# Patient Record
Sex: Female | Born: 1955
Health system: Southern US, Community
[De-identification: ages and names within clinical notes are randomized; demographics above are authoritative.]

## PROBLEM LIST (undated history)

## (undated) DIAGNOSIS — F32A Depression, unspecified: Secondary | ICD-10-CM

## (undated) DIAGNOSIS — E079 Disorder of thyroid, unspecified: Secondary | ICD-10-CM

## (undated) DIAGNOSIS — Z8719 Personal history of other diseases of the digestive system: Secondary | ICD-10-CM

## (undated) DIAGNOSIS — J189 Pneumonia, unspecified organism: Secondary | ICD-10-CM

## (undated) DIAGNOSIS — T4145XA Adverse effect of unspecified anesthetic, initial encounter: Secondary | ICD-10-CM

## (undated) DIAGNOSIS — G43909 Migraine, unspecified, not intractable, without status migrainosus: Secondary | ICD-10-CM

## (undated) DIAGNOSIS — R0600 Dyspnea, unspecified: Secondary | ICD-10-CM

## (undated) DIAGNOSIS — T8859XA Other complications of anesthesia, initial encounter: Secondary | ICD-10-CM

## (undated) DIAGNOSIS — F329 Major depressive disorder, single episode, unspecified: Secondary | ICD-10-CM

## (undated) DIAGNOSIS — F419 Anxiety disorder, unspecified: Secondary | ICD-10-CM

## (undated) HISTORY — DX: Major depressive disorder, single episode, unspecified: F32.9

## (undated) HISTORY — DX: Migraine, unspecified, not intractable, without status migrainosus: G43.909

## (undated) HISTORY — PX: LASIK: SHX215

## (undated) HISTORY — PX: ABDOMINAL HYSTERECTOMY: SHX81

## (undated) HISTORY — DX: Depression, unspecified: F32.A

## (undated) HISTORY — PX: THYROIDECTOMY: SHX17

## (undated) HISTORY — PX: THYROID CYST EXCISION: SHX2511

## (undated) HISTORY — PX: TONSILLECTOMY: SUR1361

## (undated) HISTORY — DX: Anxiety disorder, unspecified: F41.9

---
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
## 1999-01-17 ENCOUNTER — Other Ambulatory Visit: Admission: RE | Admit: 1999-01-17 | Discharge: 1999-01-17 | Payer: Self-pay | Admitting: Obstetrics & Gynecology

## 1999-07-14 ENCOUNTER — Inpatient Hospital Stay (HOSPITAL_COMMUNITY): Admission: EM | Admit: 1999-07-14 | Discharge: 1999-07-16 | Payer: Self-pay | Admitting: *Deleted

## 2000-02-13 ENCOUNTER — Other Ambulatory Visit: Admission: RE | Admit: 2000-02-13 | Discharge: 2000-02-13 | Payer: Self-pay | Admitting: Obstetrics & Gynecology

## 2001-02-23 ENCOUNTER — Other Ambulatory Visit: Admission: RE | Admit: 2001-02-23 | Discharge: 2001-02-23 | Payer: Self-pay | Admitting: Obstetrics & Gynecology

## 2002-01-05 ENCOUNTER — Encounter: Payer: Self-pay | Admitting: Obstetrics & Gynecology

## 2002-01-05 ENCOUNTER — Encounter: Admission: RE | Admit: 2002-01-05 | Discharge: 2002-01-05 | Payer: Self-pay | Admitting: Obstetrics & Gynecology

## 2002-02-23 ENCOUNTER — Other Ambulatory Visit: Admission: RE | Admit: 2002-02-23 | Discharge: 2002-02-23 | Payer: Self-pay | Admitting: Obstetrics & Gynecology

## 2002-07-28 HISTORY — PX: LEG SURGERY: SHX1003

## 2002-10-27 ENCOUNTER — Ambulatory Visit (HOSPITAL_COMMUNITY): Admission: RE | Admit: 2002-10-27 | Discharge: 2002-10-27 | Payer: Self-pay | Admitting: Surgery

## 2002-10-27 ENCOUNTER — Encounter: Payer: Self-pay | Admitting: Surgery

## 2002-11-16 ENCOUNTER — Ambulatory Visit (HOSPITAL_COMMUNITY): Admission: RE | Admit: 2002-11-16 | Discharge: 2002-11-16 | Payer: Self-pay | Admitting: Surgery

## 2002-11-16 ENCOUNTER — Encounter: Payer: Self-pay | Admitting: Surgery

## 2002-11-25 ENCOUNTER — Encounter: Payer: Self-pay | Admitting: Surgery

## 2002-11-25 ENCOUNTER — Encounter (INDEPENDENT_AMBULATORY_CARE_PROVIDER_SITE_OTHER): Payer: Self-pay | Admitting: *Deleted

## 2002-11-25 ENCOUNTER — Ambulatory Visit (HOSPITAL_COMMUNITY): Admission: RE | Admit: 2002-11-25 | Discharge: 2002-11-25 | Payer: Self-pay | Admitting: Surgery

## 2002-11-30 ENCOUNTER — Encounter: Payer: Self-pay | Admitting: Obstetrics and Gynecology

## 2002-11-30 ENCOUNTER — Ambulatory Visit (HOSPITAL_COMMUNITY): Admission: RE | Admit: 2002-11-30 | Discharge: 2002-11-30 | Payer: Self-pay | Admitting: Obstetrics and Gynecology

## 2002-12-06 ENCOUNTER — Ambulatory Visit: Admission: RE | Admit: 2002-12-06 | Discharge: 2002-12-06 | Payer: Self-pay | Admitting: Gynecology

## 2002-12-21 ENCOUNTER — Encounter: Payer: Self-pay | Admitting: Obstetrics & Gynecology

## 2002-12-27 ENCOUNTER — Inpatient Hospital Stay (HOSPITAL_COMMUNITY): Admission: RE | Admit: 2002-12-27 | Discharge: 2002-12-29 | Payer: Self-pay | Admitting: Obstetrics & Gynecology

## 2002-12-27 ENCOUNTER — Encounter (INDEPENDENT_AMBULATORY_CARE_PROVIDER_SITE_OTHER): Payer: Self-pay

## 2003-02-02 ENCOUNTER — Encounter: Payer: Self-pay | Admitting: Obstetrics & Gynecology

## 2003-02-02 ENCOUNTER — Encounter: Admission: RE | Admit: 2003-02-02 | Discharge: 2003-02-02 | Payer: Self-pay | Admitting: Obstetrics & Gynecology

## 2003-02-09 ENCOUNTER — Encounter: Admission: RE | Admit: 2003-02-09 | Discharge: 2003-02-09 | Payer: Self-pay | Admitting: Obstetrics & Gynecology

## 2003-02-09 ENCOUNTER — Encounter: Payer: Self-pay | Admitting: Obstetrics & Gynecology

## 2003-06-26 ENCOUNTER — Encounter: Admission: RE | Admit: 2003-06-26 | Discharge: 2003-06-26 | Payer: Self-pay | Admitting: Obstetrics & Gynecology

## 2003-12-28 ENCOUNTER — Encounter: Admission: RE | Admit: 2003-12-28 | Discharge: 2003-12-28 | Payer: Self-pay | Admitting: Obstetrics & Gynecology

## 2004-08-01 ENCOUNTER — Ambulatory Visit: Payer: Self-pay | Admitting: Internal Medicine

## 2004-08-02 ENCOUNTER — Ambulatory Visit (HOSPITAL_COMMUNITY): Admission: RE | Admit: 2004-08-02 | Discharge: 2004-08-02 | Payer: Self-pay | Admitting: Internal Medicine

## 2004-08-05 ENCOUNTER — Ambulatory Visit (HOSPITAL_COMMUNITY): Admission: RE | Admit: 2004-08-05 | Discharge: 2004-08-05 | Payer: Self-pay | Admitting: Internal Medicine

## 2004-09-16 ENCOUNTER — Encounter: Payer: Self-pay | Admitting: Psychiatry

## 2004-10-31 ENCOUNTER — Ambulatory Visit: Payer: Self-pay | Admitting: Internal Medicine

## 2004-11-07 ENCOUNTER — Ambulatory Visit: Payer: Self-pay | Admitting: Internal Medicine

## 2004-12-12 ENCOUNTER — Ambulatory Visit: Payer: Self-pay | Admitting: Internal Medicine

## 2004-12-26 ENCOUNTER — Ambulatory Visit: Payer: Self-pay | Admitting: Internal Medicine

## 2005-01-09 ENCOUNTER — Encounter: Admission: RE | Admit: 2005-01-09 | Discharge: 2005-01-09 | Payer: Self-pay | Admitting: Internal Medicine

## 2005-06-18 ENCOUNTER — Ambulatory Visit: Payer: Self-pay | Admitting: Internal Medicine

## 2005-07-02 ENCOUNTER — Encounter (HOSPITAL_COMMUNITY): Admission: RE | Admit: 2005-07-02 | Discharge: 2005-09-30 | Payer: Self-pay | Admitting: Internal Medicine

## 2005-07-15 ENCOUNTER — Encounter (INDEPENDENT_AMBULATORY_CARE_PROVIDER_SITE_OTHER): Payer: Self-pay | Admitting: *Deleted

## 2005-07-15 ENCOUNTER — Encounter: Admission: RE | Admit: 2005-07-15 | Discharge: 2005-07-15 | Payer: Self-pay | Admitting: Internal Medicine

## 2005-07-15 ENCOUNTER — Other Ambulatory Visit: Admission: RE | Admit: 2005-07-15 | Discharge: 2005-07-15 | Payer: Self-pay | Admitting: Interventional Radiology

## 2005-08-01 ENCOUNTER — Ambulatory Visit: Payer: Self-pay | Admitting: Endocrinology

## 2005-09-26 ENCOUNTER — Ambulatory Visit: Payer: Self-pay | Admitting: Internal Medicine

## 2006-01-14 ENCOUNTER — Encounter: Admission: RE | Admit: 2006-01-14 | Discharge: 2006-01-14 | Payer: Self-pay | Admitting: Internal Medicine

## 2006-07-01 ENCOUNTER — Ambulatory Visit: Payer: Self-pay | Admitting: Internal Medicine

## 2006-12-10 ENCOUNTER — Ambulatory Visit: Payer: Self-pay | Admitting: Endocrinology

## 2006-12-11 ENCOUNTER — Encounter: Payer: Self-pay | Admitting: Endocrinology

## 2006-12-11 LAB — CONVERTED CEMR LAB
Calcium, Total (PTH): 10 mg/dL (ref 8.4–10.5)
PTH: 14.4 pg/mL (ref 14.0–72.0)

## 2007-01-22 ENCOUNTER — Encounter: Admission: RE | Admit: 2007-01-22 | Discharge: 2007-01-22 | Payer: Self-pay | Admitting: Nurse Practitioner

## 2007-03-24 ENCOUNTER — Encounter: Payer: Self-pay | Admitting: Endocrinology

## 2007-03-24 DIAGNOSIS — I10 Essential (primary) hypertension: Secondary | ICD-10-CM | POA: Insufficient documentation

## 2007-03-24 DIAGNOSIS — E039 Hypothyroidism, unspecified: Secondary | ICD-10-CM | POA: Insufficient documentation

## 2007-07-05 ENCOUNTER — Encounter: Payer: Self-pay | Admitting: Nurse Practitioner

## 2007-07-29 ENCOUNTER — Encounter: Payer: Self-pay | Admitting: Nurse Practitioner

## 2008-02-18 ENCOUNTER — Encounter: Admission: RE | Admit: 2008-02-18 | Discharge: 2008-02-18 | Payer: Self-pay | Admitting: Nurse Practitioner

## 2010-02-10 ENCOUNTER — Emergency Department (HOSPITAL_COMMUNITY)
Admission: EM | Admit: 2010-02-10 | Discharge: 2010-02-10 | Payer: Self-pay | Source: Home / Self Care | Admitting: Emergency Medicine

## 2010-06-14 ENCOUNTER — Ambulatory Visit (HOSPITAL_COMMUNITY): Admission: RE | Admit: 2010-06-14 | Discharge: 2010-06-14 | Payer: Self-pay | Admitting: Nurse Practitioner

## 2010-08-19 ENCOUNTER — Encounter: Payer: Self-pay | Admitting: Nurse Practitioner

## 2010-10-10 ENCOUNTER — Other Ambulatory Visit: Payer: Self-pay | Admitting: Oral and Maxillofacial Surgery

## 2010-10-12 LAB — URINALYSIS, ROUTINE W REFLEX MICROSCOPIC
Glucose, UA: NEGATIVE mg/dL
Nitrite: NEGATIVE
Protein, ur: 100 mg/dL — AB
Specific Gravity, Urine: >1.03 — ABNORMAL HIGH (ref 1.005–1.030)
Urobilinogen, UA: 0.2 mg/dL (ref 0.0–1.0)
pH: 6 (ref 5.0–8.0)

## 2010-10-12 LAB — URINE MICROSCOPIC-ADD ON

## 2010-12-10 NOTE — Consult Note (Signed)
[facility]                          ENDOCRINOLOGY CONSULTATION   Yolanda Miller, Yolanda Miller                     MRN:          [id number]  DATE:12/10/2006                            DOB:          1955/10/18    REASON FOR REFERRAL:  Hyperparathyroidism.   HISTORY OF PRESENT ILLNESS:  A 55 year old woman with 3 weeks of  moderate tingling in her arms and legs.  She has no associated cramps.  She states she was found by Ninfa Linden, FNP, to have an elevated  parathyroid level.   PAST MEDICAL HISTORY:  1. Hypothyroidism.  2. Hypertension.  3. She had a right thyroid lobectomy in 1978 for a suspicious nodule,      which proved benign on pathology.   SOCIAL HISTORY:  She is married.  She works as a Sales executive.   FAMILY HISTORY:  Negative for parathyroid disease.   REVIEW OF SYSTEMS:  She denies seizure, weight gain and weight loss.   PHYSICAL EXAMINATION:  VITAL SIGNS:  Blood pressure is 126/82, heart  rate 69, temperature is 99.2.  The weight is 151.  GENERAL:  No distress.  SKIN:  Not diaphoretic.  No rash.  HEENT:  No proptosis.  No periorbital swelling.  NECK:  A healed surgical scar.  There is no nodule palpable in the  thyroid and no lymphadenopathy at the neck.  CHEST:  Clear to auscultation.  No respiratory distress.  CARDIOVASCULAR:  No JVD.  No edema.  Regular rate and rhythm.  No  murmur.  Pedal pulses are intact.  ABDOMEN:  Soft, nontender, no hepatosplenomegaly, no mass, no hernia.  EXTREMITIES:  No deformity.  NEUROLOGIC:  Alert and oriented.  Sensation is diffusely intact to  touch.  Muscle bulk and strength are normal.   LABORATORY DATA:  On Dec 08, 2006, calcium 10.0.  TSH 0.43.  On Dec 10, 2006, parathyroid hormone 14.4 pg/mL (normal 14-72).  Calcium 10.0,  which is mid-normal.   IMPRESSION:  1. Apparent recent history of hyperparathyroidism.  2. Chronic hypothyroidism exacerbated by her 1978 surgery.  3. Symptom  complex of tingling and cramps, which does not appear to be      thyroid nor parathyroid-related.   PLAN:  She should have an annual TSH and calcium level, and I am happy  to see her sooner if necessary.     Sean A. Everardo All, MD  Electronically Signed    SAE/MedQ  DD: 12/15/2006  DT: 12/15/2006  Job #: [id number]   cc:   Ninfa Linden, FNP

## 2010-12-10 NOTE — Consult Note (Signed)
[facility]                          ENDOCRINOLOGY CONSULTATION   Yolanda, Miller                     MRN:          [id number]  DATE:12/10/2006                            DOB:          05-Apr-1956    ADDENDUM:  I have received the result of a parathyroid hormone from  Ninfa Linden, FNP.  The result is 10 picograms per mL with a  simultaneous calcium of 10 mg/dL.  These are similar to the numbers we  got in our laboratory on May 15th (PTH of 14 and calcium of 10).  This  should be interpreted as a compensatory decrease in the parathyroid  hormone responding to the tendency of hydrochlorothiazide to increase  the serum calcium, with the result being that the serum calcium is  staying normal. This needs no treatment rule out followup except for an  annual serum calcium level.  I am happy to see the patient back if  necessary.     Sean A. Everardo All, MD  Electronically Signed    SAE/MedQ  DD: 12/17/2006  DT: 12/17/2006  Job #: [id number]   cc:   Ninfa Linden, FNP

## 2010-12-13 NOTE — Op Note (Signed)
[name], Miller                        ACCOUNT NO.:  [id number]   MEDICAL RECORD NO.:  [id number]                   PATIENT TYPE:  INP   LOCATION:  NA                                   FACILITY:  [facility]   PHYSICIAN:  De Blanch, M.D.         DATE OF BIRTH:  Jan 25, 1956   DATE OF PROCEDURE:  12/27/2002  DATE OF DISCHARGE:                                 OPERATIVE REPORT   PREOPERATIVE DIAGNOSES:  1. Complex pelvic mass.  2. Elevated CA125.  3. Intermittent severe pelvic pain.  4. Uterine fibroids.   POSTOPERATIVE DIAGNOSES:  1. Right ovarian mature cystic teratoma with struma ovarii.  2. Uterine fibroids.   PROCEDURES:  Total abdominal hysterectomy, bilateral salpingo-oophorectomy.   SURGEON:  De Blanch, M.D.   ASSISTANTS:  Gerrit Friends. Aldona Bar, M.D., and Telford Nab, R.N.   ANESTHESIA:  General with orotracheal tube.   ESTIMATED BLOOD LOSS:  150 mL.   SURGICAL FINDINGS:  At the time of exploratory laparotomy, the upper abdomen  was normal.  The appendix appeared normal.  The uterus is enlarged to  approximately 10-12 weeks' gestational size with fibroids.  The right ovary  was replaced by a smooth cystic tumor measuring approximately 8 cm in  diameter.  On frozen section we were told this is a mature cystic teratoma  with struma ovarii.  The left tube and ovary appeared normal.   DESCRIPTION OF PROCEDURE:  The patient was brought to the operating room and  after satisfactory attainment of general anesthesia was placed in modified  lithotomy position in the Ragan stirrups.  The anterior abdominal wall,  perineum, and vagina were prepped with Betadine, a Foley catheter was  inserted, and the patient was draped.  The abdomen was entered through a  Pfannenstiel incision.  Peritoneal washings were obtained.  The upper  abdomen was explored with the above-noted findings.  A Bookwalter retractor  was positioned, and the bowel was packed out of the  pelvis.  The right  retroperitoneal spaces were opened, identifying the external iliac vessels  and ureter.  The ovarian vessels were skeletonized, clamped, cut, free-tied,  and suture ligated.  The posterior leaf of the broad ligament was incised  beneath the fallopian tube, mobilizing the tube and ovary.  The uterine  cornu was grasped with a large Kelly clamp and back-clamped with a  parametrial clamp.  This was divided.  The right tube and ovary were handed  off the operative field and submitted for frozen section with the above-  noted findings.  The left round ligament was divided and the retroperitoneal  space opened.  The vessels and ureter were again identified.  The ovarian  vessels were skeletonized, clamped, cut, free-tied, and suture ligated.  The  bladder flap was advanced with sharp and blunt dissection.  The uterine  vessels were skeletonized, clamped, cut, and suture ligated.  The  paracervical and cardinal ligaments were clamped, cut, and suture  ligated.  The rectovaginal septum was opened, further mobilizing the cervix.  The  vaginal angles were then crossclamped, divided, and the vagina transected  from its junction with the cervix.  The uterus, cervix, tubes, and ovaries  were handed off the operative field as a single specimen.  The vaginal  angles were transfixed with 0 Vicryl and the central portion of the vagina  closed with interrupted figure-of-eight sutures of 0 Vicryl.   The pelvis was irrigated with saline.  Hemostasis was ascertained.  Additional hemostasis was achieved with cautery and suture on the posterior  aspect of the left lateral vagina.  At this juncture the hemostasis was  excellent.  The packs and retractors were removed.  The anterior abdominal  wall was closed in layers, the first being a running 2-0 Vicryl suture on  the peritoneum.  The fascia was closed with a continuous suture of 0 PDS.  The subcutaneous tissue was irrigated, hemostasis  achieved with cautery, and  the skin closed with skin staples.  A dressing was applied.  The patient was  awakened from anesthesia and taken to the recovery room in satisfactory  condition.  Sponge, needle, and instrument counts were correct x2.                                               De Blanch, M.D.    DC/MEDQ  D:  12/27/2002  T:  12/27/2002  Job:  [id number]   cc:   Gerrit Friends. Aldona Bar, M.D.  [address]  [place]  [place] [address]  Fax: [phone]   Telford Nab, R.N.

## 2010-12-13 NOTE — Discharge Summary (Signed)
NAMEEMORI, Yolanda Miller                        ACCOUNT NO.:  [id number]   MEDICAL RECORD NO.:  [id number]                   PATIENT TYPE:  INP   LOCATION:  0447                                 FACILITY:  [facility]   PHYSICIAN:  Gerrit Friends. Aldona Bar, M.D.                DATE OF BIRTH:  24-May-1956   DATE OF ADMISSION:  12/27/2002  DATE OF DISCHARGE:  12/29/2002                                 DISCHARGE SUMMARY   DISCHARGE DIAGNOSES:  1. Benign ovarian mature cystic teratoma associated with extensive stroma     eovarii, right ovary.  2. Adenomyosis of the uterus.  3. Leiomyomata uteri.  4. Benign uterine adenomatoid tumor.   SUMMARY:  This 55 year old female was admitted with a complex pelvic mass  and pelvic pain for exploration.  She was seen by De Blanch,  M.D. in consultation and De Blanch, M.D. was the primary  surgeon.  She underwent on the day of admission after a normal preoperative  work-up a total abdominal hysterectomy and bilateral salpingo-oophorectomy  and frozen section and a diagnosis of a dermoid was made.  Postoperatively  she did well.  She remained essentially afebrile during her hospital course.  Her discharge hemoglobin was 9.7 with a white count of 10,300 and a platelet  count of 220,000.  In addition, thyroid function studies were done prior to  discharge and a T4 and TSH were grossly within normal limits.  On the  morning of June 3 she was ambulating well, tolerating a regular diet well,  having normal bowel and bladder function, was afebrile.  Her wound was clean  and dry.  She was comfortable and she was discharged to home.  Staples were  removed on the morning of discharge.  The wound was Steri-Stripped.  She was  given all instructions at the time of discharge and understood all  instructions well.   DISCHARGE MEDICATIONS:  1. Tylox one to two q.4-6h. as needed for severe pain.  2. Motrin 600 mg q.6h. as needed for less severe pain.  3.  She will continue her Synthroid 0.075 mg daily.  4. She will use stool softeners, gas medication, etc. as needed.  5. She was not discharged on any estrogen replacement therapy as she was     having no symptomatology.    DISCHARGE INSTRUCTIONS:  She was instructed to return to the office in four  weeks' time and given all careful instructions at the time of discharge  which she understood well.   CONDITION ON DISCHARGE:  Improved.                                               Gerrit Friends. Aldona Bar, M.D.    RMW/MEDQ  D:  12/29/2002  T:  12/29/2002  Job:  [id number]  cc:   De Blanch, M.D.   Currie Paris, M.D.  [address]  [place]  [place] [address]  Fax: [phone]

## 2010-12-13 NOTE — Consult Note (Signed)
NAMEKEANU, FRICKEY                        ACCOUNT NO.:  [id number]   MEDICAL RECORD NO.:  [id number]                   PATIENT TYPE:  OUT   LOCATION:  GYN                                  FACILITY:  [facility]   PHYSICIAN:  De Blanch, M.D.         DATE OF BIRTH:  Dec 19, 1955   DATE OF CONSULTATION:  12/06/2002  DATE OF DISCHARGE:                                   CONSULTATION   HISTORY OF PRESENT ILLNESS:  A 55 year old white female seen in consultation  at the request of Carrington Clamp, M.D. and Gerrit Friends. Aldona Bar, M.D.  The  patient was recently found to have a knot in her abdomen.  Evaluation has  included a CT scan of the abdomen and pelvis as well as ultrasound.  CT scan  shows a normal upper abdomen, although there is a pelvic mass measuring 9.3  cm in maximum diameter which is thought to be compatible with a dermoid  containing fat and a linear area of calcification, although there is some  lobulation of a more soft tissue solid mass.  The patient also has an  enlarged uterus secondary to uterine fibroids.  The ultrasound shows the  uterus measures 11.8 x 5.9 x 7.3 cm.  The adnexal mass on ultrasound has a  thick rim with an irregularly shaped central area and a solid mass measuring  3 x 1.8 x 2.5 cm in the superior aspect.  There is some free fluid noted as  well.   PAST MEDICAL HISTORY:  Thyroid nodule (benign).   PAST SURGICAL HISTORY:  1. Thyroid biopsy.  2. Cesarean section.  3. Anal fissure repair.  4. Cold knife conization with subsequent Pap smears that are normal.   ALLERGIES:  None.   CURRENT MEDICATIONS:  Synthroid.   FAMILY HISTORY:  The patient's mother had a liposarcoma.  There are no  family members with ovarian cancer, colon cancer, or gynecologic cancer.   REVIEW OF SYSTEMS:  Otherwise negative.   PHYSICAL EXAMINATION:  VITAL SIGNS:  Height 5 feet 4 inches, weight 143  pounds.  GENERAL:  The patient is a healthy, pleasant white female in  no acute  distress.  HEENT:  Negative.  NECK:  Supple without thyromegaly.  LYMPH:  There is no supraclavicular or inguinal adenopathy.  ABDOMEN:  Soft, nontender.  There is a mass palpable in the suprapubic  region approximately 5 cm above the pubis.  This is nontender.  PELVIC:  EGBUS, vagina, bladder, urethra are normal.  Cervix is normal.  Uterus is difficult to separate from a mass __________ there is  approximately 15 cm mass noted.  Rectovaginal examination confirms.  There  is no cul-de-sac nodularity.   IMPRESSION:  Complex pelvic mass, possibly a dermoid.  Of note, the  patient's CA-125 value is 99.  Obviously, this could be on the basis of  endometriosis, fibroids, or other benign causes, although ovarian cancer  cannot be entirely excluded  short of obtaining a histologic diagnosis.   Would recommend the patient undergo an exploratory laparotomy and total  abdominal hysterectomy with bilateral salpingo-oophorectomy.  She is given  the option of preserving normal ovary if one is found but she indicates she  would like to go ahead and have both ovaries removed.  She understands the  implications of hormonal replacement therapy.   We will coordinate scheduling and surgery with Gerrit Friends. Aldona Bar, M.D. and  anticipate surgery for December 27, 2002.   Risks of surgery including hemorrhage, infection, injury to adjacent  viscera, thromboembolic complications, and anesthetic risks have been  outlined to the patient and her husband.  All their questions are answered  and they wish to proceed with surgery understanding that if ovarian cancer  is detected the surgery would be more extended including surgical staging.                                               De Blanch, M.D.    DC/MEDQ  D:  12/06/2002  T:  12/06/2002  Job:  [id number]   cc:   Gerrit Friends. Aldona Bar, M.D.  [address]  [place]  [place] [address]  Fax: [phone]   Carrington Clamp, M.D.  [address]  [address]  [place], [place] [address]  Fax: [phone]   Telford Nab, R.N.

## 2011-07-28 ENCOUNTER — Other Ambulatory Visit (HOSPITAL_COMMUNITY): Payer: Self-pay | Admitting: Nurse Practitioner

## 2011-07-28 DIAGNOSIS — Z139 Encounter for screening, unspecified: Secondary | ICD-10-CM

## 2011-08-04 ENCOUNTER — Ambulatory Visit (HOSPITAL_COMMUNITY): Payer: Self-pay

## 2011-08-11 ENCOUNTER — Ambulatory Visit (HOSPITAL_COMMUNITY)
Admission: RE | Admit: 2011-08-11 | Discharge: 2011-08-11 | Disposition: A | Discharge status: Home / Self Care | Payer: BC Managed Care – PPO | Source: Ambulatory Visit | Attending: Nurse Practitioner | Admitting: Nurse Practitioner

## 2011-08-11 DIAGNOSIS — Z139 Encounter for screening, unspecified: Secondary | ICD-10-CM

## 2011-08-11 DIAGNOSIS — Z1231 Encounter for screening mammogram for malignant neoplasm of breast: Secondary | ICD-10-CM | POA: Insufficient documentation

## 2011-12-21 ENCOUNTER — Encounter (HOSPITAL_COMMUNITY): Payer: Self-pay | Admitting: *Deleted

## 2011-12-21 ENCOUNTER — Emergency Department (HOSPITAL_COMMUNITY)
Admission: EM | Admit: 2011-12-21 | Discharge: 2011-12-22 | Disposition: A | Discharge status: Home / Self Care | Payer: BC Managed Care – PPO | Attending: Emergency Medicine | Admitting: Emergency Medicine

## 2011-12-21 DIAGNOSIS — N390 Urinary tract infection, site not specified: Secondary | ICD-10-CM

## 2011-12-21 DIAGNOSIS — E079 Disorder of thyroid, unspecified: Secondary | ICD-10-CM | POA: Insufficient documentation

## 2011-12-21 HISTORY — DX: Disorder of thyroid, unspecified: E07.9

## 2011-12-21 NOTE — ED Notes (Signed)
Pt c/o blood in urine. Pt states she noticed it today

## 2011-12-22 LAB — URINE MICROSCOPIC-ADD ON

## 2011-12-22 LAB — URINALYSIS, ROUTINE W REFLEX MICROSCOPIC
Bilirubin Urine: NEGATIVE
Glucose, UA: 100 mg/dL — AB
Ketones, ur: NEGATIVE mg/dL
Nitrite: POSITIVE — AB
Protein, ur: 30 mg/dL — AB
Specific Gravity, Urine: <1.005 — ABNORMAL LOW (ref 1.005–1.030)
Urobilinogen, UA: 4 mg/dL — ABNORMAL HIGH (ref 0.0–1.0)
pH: 6.5 (ref 5.0–8.0)

## 2011-12-22 MED ORDER — CEPHALEXIN 500 MG PO CAPS
1000.0000 mg | ORAL_CAPSULE | Freq: Once | ORAL | Status: AC
  Administered 2011-12-22: 1000 mg via ORAL
  Filled 2011-12-22: qty 2

## 2011-12-22 MED ORDER — FLUCONAZOLE 200 MG PO TABS: ORAL_TABLET | ORAL | Status: DC

## 2011-12-22 MED ORDER — CEPHALEXIN 500 MG PO CAPS: 1000.0000 mg | ORAL_CAPSULE | Freq: Two times a day (BID) | ORAL | Status: AC

## 2011-12-22 NOTE — ED Provider Notes (Signed)
Medical screening examination/treatment/procedure(s) were performed by non-physician practitioner and as supervising physician I was immediately available for consultation/collaboration.  Randye Treichler S. Lakasha Mcfall, MD 12/22/11 2334 

## 2011-12-22 NOTE — ED Provider Notes (Signed)
History     CSN: [id number]  Arrival date & time 12/21/11  2240   First MD Initiated Contact with Patient 12/21/11 2325      Chief Complaint  Patient presents with  . Hematuria    (Consider location/radiation/quality/duration/timing/severity/associated sxs/prior treatment) HPI Comments: Yolanda Miller presents with new onset hematuria, increased urinary frequency and low back pain which started today.  She has started taking Azo prior to arrival and her symptoms have improved.  She denies nausea or vomiting and has no fevers.  She does have an occasional UTI history and has no history of kidney stones.  She denies any other symptoms today, past medical history significant for thyroid disease.  Patient is a 56 y.o. female presenting with hematuria. The history is provided by the patient.  Hematuria Irritative symptoms include frequency and urgency. Pertinent negatives include no abdominal pain, fever or nausea.    Past Medical History  Diagnosis Date  . Thyroid disease     Past Surgical History  Procedure Date  . Abdominal hysterectomy   . Thyroid cyst excision     History reviewed. No pertinent family history.  History  Substance Use Topics  . Smoking status: Current Everyday Smoker -- 1.0 packs/day  . Smokeless tobacco: Not on file  . Alcohol Use: Yes     occasionally    OB History    Grav Para Term Preterm Abortions TAB SAB Ect Mult Living                  Review of Systems  Constitutional: Negative for fever.  HENT: Negative for congestion, sore throat and neck pain.   Eyes: Negative.   Respiratory: Negative for chest tightness and shortness of breath.   Cardiovascular: Negative for chest pain.  Gastrointestinal: Negative for nausea and abdominal pain.  Genitourinary: Positive for urgency, frequency and hematuria.  Musculoskeletal: Negative for joint swelling and arthralgias.  Skin: Negative.  Negative for rash and wound.  Neurological: Negative for  dizziness, weakness, light-headedness, numbness and headaches.  Hematological: Negative.   Psychiatric/Behavioral: Negative.     Allergies  Demerol and Propoxyphene-acetaminophen  Home Medications   Current Outpatient Rx  Name Route Sig Dispense Refill  . CEPHALEXIN 500 MG PO CAPS Oral Take 2 capsules (1,000 mg total) by mouth 2 (two) times daily. 28 capsule 0    BP 119/76  Pulse 72  Temp(Src) 98.1 F (36.7 C) (Oral)  Resp 18  Ht 5\' 4"  (1.626 m)  Wt 150 lb (68.04 kg)  BMI 25.75 kg/m2  SpO2 98%  Physical Exam  Nursing note and vitals reviewed. Constitutional: She appears well-developed and well-nourished.  HENT:  Head: Normocephalic and atraumatic.  Neck: Neck supple.  Cardiovascular: Normal rate and regular rhythm.   Pulmonary/Chest: Effort normal and breath sounds normal.  Abdominal: Soft. Bowel sounds are normal. She exhibits no distension and no mass. There is no tenderness.  Musculoskeletal: Normal range of motion.  Neurological: She is alert.  Skin: Skin is warm and dry.  Psychiatric: She has a normal mood and affect.    ED Course  Procedures (including critical care time)  Labs Reviewed  URINALYSIS, ROUTINE W REFLEX MICROSCOPIC - Abnormal; Notable for the following:    Color, Urine ORANGE (*) BIOCHEMICALS MAY BE AFFECTED BY COLOR   Specific Gravity, Urine <1.005 (*)    Glucose, UA 100 (*)    Hgb urine dipstick TRACE (*)    Protein, ur 30 (*)    Urobilinogen, UA 4.0 (*)  Nitrite POSITIVE (*)    Leukocytes, UA MODERATE (*)    All other components within normal limits  URINE MICROSCOPIC-ADD ON - Abnormal; Notable for the following:    Bacteria, UA FEW (*)    All other components within normal limits  URINE CULTURE   No results found.   1. UTI (lower urinary tract infection)       MDM  Keflex 1 g given ED and prescription given for same for 7 day treatment.  Patient to followup with PCP after antibiotics completed for a repeat UA.  Recheck  sooner for worsened symptoms including development of fever or nausea and vomiting.        Burgess Amor, [place] 12/22/11 [phone]

## 2011-12-22 NOTE — Discharge Instructions (Signed)
Urinary Tract Infection Infections of the urinary tract can start in several places. A bladder infection (cystitis), a kidney infection (pyelonephritis), and a prostate infection (prostatitis) are different types of urinary tract infections (UTIs). They usually get better if treated with medicines (antibiotics) that kill germs. Take all the medicine until it is gone. You or your child may feel better in a few days, but TAKE ALL MEDICINE or the infection may not respond and may become more difficult to treat. HOME CARE INSTRUCTIONS   Drink enough water and fluids to keep the urine clear or pale yellow. Cranberry juice is especially recommended, in addition to large amounts of water.   Avoid caffeine, tea, and carbonated beverages. They tend to irritate the bladder.   Alcohol may irritate the prostate.   Only take over-the-counter or prescription medicines for pain, discomfort, or fever as directed by your caregiver.  To prevent further infections:  Empty the bladder often. Avoid holding urine for long periods of time.   After a bowel movement, women should cleanse from front to back. Use each tissue only once.   Empty the bladder before and after sexual intercourse.  FINDING OUT THE RESULTS OF YOUR TEST Not all test results are available during your visit. If your or your child's test results are not back during the visit, make an appointment with your caregiver to find out the results. Do not assume everything is normal if you have not heard from your caregiver or the medical facility. It is important for you to follow up on all test results. SEEK MEDICAL CARE IF:   There is back pain.   Your baby is older than 3 months with a rectal temperature of 100.5 F (38.1 C) or higher for more than 1 day.   Your or your child's problems (symptoms) are no better in 3 days. Return sooner if you or your child is getting worse.  SEEK IMMEDIATE MEDICAL CARE IF:   There is severe back pain or lower  abdominal pain.   You or your child develops chills.   You have a fever.   Your baby is older than 3 months with a rectal temperature of 102 F (38.9 C) or higher.   Your baby is 63 months old or younger with a rectal temperature of 100.4 F (38 C) or higher.   There is nausea or vomiting.   There is continued burning or discomfort with urination.  MAKE SURE YOU:   Understand these instructions.   Will watch your condition.   Will get help right away if you are not doing well or get worse.  Document Released: 04/23/2005 Document Revised: 07/03/2011 Document Reviewed: 11/26/2006 [facility] Patient Information 2012 [place], [place].   Take your next dose of antibiotic tomorrow morning.  Make sure you're drinking plenty of fluids. You may continue using your AZO per the  package instructions for symptom relief.  Get rechecked urgently for any fever or if you develop nausea or vomiting.  Otherwise plan a lab visit to your doctor's office for a repeat urinalysis once your antibiotic is completed.

## 2011-12-23 LAB — URINE CULTURE
Colony Count: 10000
Culture  Setup Time: 201305280125
Special Requests: NORMAL

## 2013-11-25 ENCOUNTER — Other Ambulatory Visit (HOSPITAL_COMMUNITY): Payer: Self-pay | Admitting: Family Medicine

## 2013-11-25 DIAGNOSIS — Z139 Encounter for screening, unspecified: Secondary | ICD-10-CM

## 2013-12-02 ENCOUNTER — Ambulatory Visit (HOSPITAL_COMMUNITY)
Admission: RE | Admit: 2013-12-02 | Discharge: 2013-12-02 | Disposition: A | Discharge status: Home / Self Care | Payer: BC Managed Care – PPO | Source: Ambulatory Visit | Attending: Family Medicine | Admitting: Family Medicine

## 2013-12-02 DIAGNOSIS — Z1231 Encounter for screening mammogram for malignant neoplasm of breast: Secondary | ICD-10-CM | POA: Insufficient documentation

## 2013-12-02 DIAGNOSIS — Z139 Encounter for screening, unspecified: Secondary | ICD-10-CM

## 2014-11-15 ENCOUNTER — Encounter: Payer: Self-pay | Admitting: Internal Medicine

## 2015-02-02 ENCOUNTER — Other Ambulatory Visit (HOSPITAL_COMMUNITY): Payer: Self-pay | Admitting: Nurse Practitioner

## 2015-02-02 DIAGNOSIS — Z1231 Encounter for screening mammogram for malignant neoplasm of breast: Secondary | ICD-10-CM

## 2015-03-05 ENCOUNTER — Ambulatory Visit (HOSPITAL_COMMUNITY)
Admission: RE | Admit: 2015-03-05 | Discharge: 2015-03-05 | Disposition: A | Discharge status: Home / Self Care | Payer: BC Managed Care – PPO | Source: Ambulatory Visit | Attending: Nurse Practitioner | Admitting: Nurse Practitioner

## 2015-03-05 DIAGNOSIS — Z1231 Encounter for screening mammogram for malignant neoplasm of breast: Secondary | ICD-10-CM | POA: Diagnosis present

## 2016-02-15 ENCOUNTER — Other Ambulatory Visit (HOSPITAL_COMMUNITY): Payer: Self-pay | Admitting: Nurse Practitioner

## 2016-02-15 DIAGNOSIS — Z1231 Encounter for screening mammogram for malignant neoplasm of breast: Secondary | ICD-10-CM

## 2016-03-27 ENCOUNTER — Ambulatory Visit (HOSPITAL_COMMUNITY): Payer: BC Managed Care – PPO

## 2016-04-14 ENCOUNTER — Ambulatory Visit: Payer: BC Managed Care – PPO

## 2016-04-14 ENCOUNTER — Ambulatory Visit (HOSPITAL_COMMUNITY)
Admission: RE | Admit: 2016-04-14 | Discharge: 2016-04-14 | Disposition: A | Discharge status: Home / Self Care | Payer: BC Managed Care – PPO | Source: Ambulatory Visit | Attending: Nurse Practitioner | Admitting: Nurse Practitioner

## 2016-04-14 DIAGNOSIS — Z1231 Encounter for screening mammogram for malignant neoplasm of breast: Secondary | ICD-10-CM | POA: Insufficient documentation

## 2016-04-18 ENCOUNTER — Other Ambulatory Visit: Payer: Self-pay | Admitting: Nurse Practitioner

## 2016-04-18 DIAGNOSIS — R928 Other abnormal and inconclusive findings on diagnostic imaging of breast: Secondary | ICD-10-CM

## 2016-04-28 ENCOUNTER — Ambulatory Visit
Admission: RE | Admit: 2016-04-28 | Discharge: 2016-04-28 | Disposition: A | Discharge status: Home / Self Care | Payer: BC Managed Care – PPO | Source: Ambulatory Visit | Attending: Nurse Practitioner | Admitting: Nurse Practitioner

## 2016-04-28 DIAGNOSIS — R928 Other abnormal and inconclusive findings on diagnostic imaging of breast: Secondary | ICD-10-CM

## 2016-05-13 ENCOUNTER — Encounter (HOSPITAL_COMMUNITY): Payer: BC Managed Care – PPO

## 2017-02-26 ENCOUNTER — Other Ambulatory Visit (HOSPITAL_COMMUNITY): Payer: Self-pay | Admitting: Nurse Practitioner

## 2017-02-26 DIAGNOSIS — Z1231 Encounter for screening mammogram for malignant neoplasm of breast: Secondary | ICD-10-CM

## 2017-05-04 ENCOUNTER — Ambulatory Visit (HOSPITAL_COMMUNITY)
Admission: RE | Admit: 2017-05-04 | Discharge: 2017-05-04 | Disposition: A | Discharge status: Home / Self Care | Payer: BC Managed Care – PPO | Source: Ambulatory Visit | Attending: Nurse Practitioner | Admitting: Nurse Practitioner

## 2017-05-04 DIAGNOSIS — Z1231 Encounter for screening mammogram for malignant neoplasm of breast: Secondary | ICD-10-CM | POA: Diagnosis not present

## 2017-11-12 ENCOUNTER — Ambulatory Visit (INDEPENDENT_AMBULATORY_CARE_PROVIDER_SITE_OTHER): Payer: BC Managed Care – PPO | Admitting: Orthopaedic Surgery

## 2017-11-12 ENCOUNTER — Encounter (INDEPENDENT_AMBULATORY_CARE_PROVIDER_SITE_OTHER): Payer: Self-pay | Admitting: Orthopaedic Surgery

## 2017-11-12 ENCOUNTER — Ambulatory Visit (INDEPENDENT_AMBULATORY_CARE_PROVIDER_SITE_OTHER): Payer: Self-pay

## 2017-11-12 VITALS — BP 146/91 | HR 84 | Ht 64.0 in | Wt 140.0 lb

## 2017-11-12 DIAGNOSIS — M65312 Trigger thumb, left thumb: Secondary | ICD-10-CM

## 2017-11-12 DIAGNOSIS — M79642 Pain in left hand: Secondary | ICD-10-CM

## 2017-11-12 MED ORDER — LIDOCAINE HCL 1 % IJ SOLN
0.3000 mL | INTRAMUSCULAR | Status: AC | PRN
  Administered 2017-11-12: .3 mL

## 2017-11-12 MED ORDER — BUPIVACAINE HCL 0.5 % IJ SOLN
0.3300 mL | INTRAMUSCULAR | Status: AC | PRN
  Administered 2017-11-12: .33 mL

## 2017-11-12 MED ORDER — METHYLPREDNISOLONE ACETATE 40 MG/ML IJ SUSP
13.3300 mg | INTRAMUSCULAR | Status: AC | PRN
  Administered 2017-11-12: 13.33 mg

## 2017-11-12 NOTE — Progress Notes (Signed)
Office Visit Note   Patient: Yolanda Miller           Date of Birth: 10/22/1955           MRN: [id number] Visit Date: 11/12/2017              Requested by: Rodena Medin, [place], [place] [id number] PCP: Rodena Medin, FNP   Assessment & Plan: Visit Diagnoses:  1. Pain in left hand   2. Trigger thumb, left thumb     Plan: Action performed A1 pulley left thumb.  She tolerated the injection well dorsal splint applied she can wear it for several days and then remove it for bathing and washing her hand.  If she has persistent triggering she will call.  We discussed trigger thumb release if the injection is not successful.  Follow-Up Instructions: Return if symptoms worsen or fail to improve.   Orders:  Orders Placed This Encounter  Procedures  . XR Hand Complete Left   No orders of the defined types were placed in this encounter.     Procedures: Hand/UE Inj: L thumb A1 for trigger finger on 11/12/2017 3:44 PM Medications: 0.3 mL lidocaine 1 %; 13.33 mg methylPREDNISolone acetate 40 MG/ML; 0.33 mL bupivacaine 0.5 %      Clinical Data: No additional findings.   Subjective: Chief Complaint  Patient presents with  . 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  She had trigger thumb injection by Dr. Leonides Schanz many years ago.  She is noticed triggering for the last 3 weeks left thumb wakes her up at night with it locked she has to shake her hand and manipulated in order to get full extension.  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  Review of Systems positive for previous benign tumor removal left distal thigh possible osteochondroma by her description.  Previous thyroid surgery C-section abdominal tumor benign removal.  History of hypothyroidism migraines with eye pain pneumonia and anxiety.  Patient smokes 1/2  pack/day otherwise 14 point review of systems  negative is good attributes to HPI.   Objective: Vital Signs: BP (!) 146/91   Pulse 84   Ht 5\' 4"  (1.626 m)   Wt 140 lb (63.5 kg)   BMI 24.03 kg/m   Physical Exam  Constitutional: She is oriented to person, place, and time. She appears well-developed.  HENT:  Head: Normocephalic.  Right Ear: External ear normal.  Left Ear: External ear normal.  Eyes: Pupils are equal, round, and reactive to light.  Neck: No tracheal deviation present. No thyromegaly present.  Cardiovascular: Normal rate.  Pulmonary/Chest: Effort normal.  Abdominal: Soft.  Neurological: She is alert and oriented to person, place, and time.  Skin: Skin is warm and dry.  Psychiatric: She has a normal mood and affect. Her behavior is normal.    Ortho Exam patient has normal wrist range of motion mild CMC positive grind test.  A1 pulley over the left thumb is painful with repetitive locking and a palpable nodule in the flexor tendon.  Sensation of the fingertip is intact. Specialty Comments:  No specialty comments available.  Imaging: No results found.   PMFS History: Patient Active Problem List   Diagnosis Date Noted  . HYPOTHYROIDISM 03/24/2007  . HYPERTENSION 03/24/2007   Past Medical History:  Diagnosis Date  . Anxiety   .  Depression   . Migraines   . Thyroid disease     Family History  Problem Relation Age of Onset  . Cancer Mother   . Cancer Father   . Cancer Brother     Past Surgical History:  Procedure Laterality Date  . ABDOMINAL HYSTERECTOMY    . CESAREAN SECTION    . LEG SURGERY Left 2004  . THYROID CYST EXCISION    . THYROIDECTOMY     parathyroid as well   Social History   Occupational History  . Not on file  Tobacco Use  . Smoking status: Current Every Day Smoker    Packs/day: 0.50  . Smokeless tobacco: Never Used  Substance and Sexual Activity  . Alcohol use: Not Currently    Comment: occasionally  . Drug use: Not on file  . Sexual activity: Not on file

## 2018-07-02 ENCOUNTER — Other Ambulatory Visit (HOSPITAL_COMMUNITY): Payer: Self-pay | Admitting: Nurse Practitioner

## 2018-07-02 DIAGNOSIS — Z1231 Encounter for screening mammogram for malignant neoplasm of breast: Secondary | ICD-10-CM

## 2018-07-12 ENCOUNTER — Ambulatory Visit (HOSPITAL_COMMUNITY)
Admission: RE | Admit: 2018-07-12 | Discharge: 2018-07-12 | Disposition: A | Discharge status: Home / Self Care | Payer: BC Managed Care – PPO | Source: Ambulatory Visit | Attending: Nurse Practitioner | Admitting: Nurse Practitioner

## 2018-07-12 DIAGNOSIS — Z1231 Encounter for screening mammogram for malignant neoplasm of breast: Secondary | ICD-10-CM | POA: Diagnosis present

## 2018-09-12 ENCOUNTER — Encounter (HOSPITAL_COMMUNITY): Payer: Self-pay | Admitting: Emergency Medicine

## 2018-09-12 ENCOUNTER — Other Ambulatory Visit: Payer: Self-pay

## 2018-09-12 ENCOUNTER — Emergency Department (HOSPITAL_COMMUNITY): Payer: BC Managed Care – PPO

## 2018-09-12 ENCOUNTER — Emergency Department (HOSPITAL_COMMUNITY)
Admission: EM | Admit: 2018-09-12 | Discharge: 2018-09-12 | Disposition: A | Discharge status: Home / Self Care | Payer: BC Managed Care – PPO | Attending: Emergency Medicine | Admitting: Emergency Medicine

## 2018-09-12 DIAGNOSIS — E039 Hypothyroidism, unspecified: Secondary | ICD-10-CM | POA: Insufficient documentation

## 2018-09-12 DIAGNOSIS — R0602 Shortness of breath: Secondary | ICD-10-CM | POA: Insufficient documentation

## 2018-09-12 DIAGNOSIS — I10 Essential (primary) hypertension: Secondary | ICD-10-CM | POA: Insufficient documentation

## 2018-09-12 DIAGNOSIS — R109 Unspecified abdominal pain: Secondary | ICD-10-CM | POA: Diagnosis present

## 2018-09-12 DIAGNOSIS — F17200 Nicotine dependence, unspecified, uncomplicated: Secondary | ICD-10-CM | POA: Diagnosis not present

## 2018-09-12 DIAGNOSIS — Z79899 Other long term (current) drug therapy: Secondary | ICD-10-CM | POA: Insufficient documentation

## 2018-09-12 LAB — CBC
HCT: 44.8 % (ref 36.0–46.0)
Hemoglobin: 14.2 g/dL (ref 12.0–15.0)
MCH: 30.3 pg (ref 26.0–34.0)
MCHC: 31.7 g/dL (ref 30.0–36.0)
MCV: 95.7 fL (ref 80.0–100.0)
Platelets: 334 10*3/uL (ref 150–400)
RBC: 4.68 MIL/uL (ref 3.87–5.11)
RDW: 12.8 % (ref 11.5–15.5)
WBC: 8.8 10*3/uL (ref 4.0–10.5)
nRBC: 0 % (ref 0.0–0.2)

## 2018-09-12 LAB — URINALYSIS, ROUTINE W REFLEX MICROSCOPIC
Bacteria, UA: NONE SEEN
Bilirubin Urine: NEGATIVE
Glucose, UA: NEGATIVE mg/dL
Hgb urine dipstick: NEGATIVE
Ketones, ur: NEGATIVE mg/dL
Nitrite: NEGATIVE
Protein, ur: NEGATIVE mg/dL
Specific Gravity, Urine: 1.023 (ref 1.005–1.030)
pH: 5 (ref 5.0–8.0)

## 2018-09-12 LAB — COMPREHENSIVE METABOLIC PANEL WITH GFR
ALT: 28 U/L (ref 0–44)
AST: 18 U/L (ref 15–41)
Albumin: 4.4 g/dL (ref 3.5–5.0)
Alkaline Phosphatase: 62 U/L (ref 38–126)
Anion gap: 11 (ref 5–15)
BUN: 15 mg/dL (ref 8–23)
CO2: 22 mmol/L (ref 22–32)
Calcium: 8.8 mg/dL — ABNORMAL LOW (ref 8.9–10.3)
Chloride: 103 mmol/L (ref 98–111)
Creatinine, Ser: 0.65 mg/dL (ref 0.44–1.00)
GFR calc Af Amer: >60 mL/min
GFR calc non Af Amer: >60 mL/min
Glucose, Bld: 113 mg/dL — ABNORMAL HIGH (ref 70–99)
Potassium: 4 mmol/L (ref 3.5–5.1)
Sodium: 136 mmol/L (ref 135–145)
Total Bilirubin: 0.7 mg/dL (ref 0.3–1.2)
Total Protein: 7.8 g/dL (ref 6.5–8.1)

## 2018-09-12 LAB — LIPASE, BLOOD: Lipase: 21 U/L (ref 11–51)

## 2018-09-12 MED ORDER — DEXAMETHASONE SODIUM PHOSPHATE 10 MG/ML IJ SOLN
10.0000 mg | Freq: Once | INTRAMUSCULAR | Status: AC
  Administered 2018-09-12: 10 mg via INTRAVENOUS
  Filled 2018-09-12: qty 1

## 2018-09-12 MED ORDER — KETOROLAC TROMETHAMINE 30 MG/ML IJ SOLN
30.0000 mg | Freq: Once | INTRAMUSCULAR | Status: AC
  Administered 2018-09-12: 30 mg via INTRAVENOUS
  Filled 2018-09-12: qty 1

## 2018-09-12 MED ORDER — SODIUM CHLORIDE 0.9% FLUSH
3.0000 mL | Freq: Once | INTRAVENOUS | Status: AC
  Administered 2018-09-12: 3 mL via INTRAVENOUS

## 2018-09-12 MED ORDER — IPRATROPIUM-ALBUTEROL 0.5-2.5 (3) MG/3ML IN SOLN
3.0000 mL | RESPIRATORY_TRACT | Status: DC
  Administered 2018-09-12: 3 mL via RESPIRATORY_TRACT
  Filled 2018-09-12: qty 3

## 2018-09-12 MED ORDER — LACTATED RINGERS IV BOLUS
1000.0000 mL | Freq: Once | INTRAVENOUS | Status: AC
  Administered 2018-09-12: 1000 mL via INTRAVENOUS

## 2018-09-12 MED ORDER — DOXYCYCLINE HYCLATE 100 MG PO TABS
100.0000 mg | ORAL_TABLET | Freq: Once | ORAL | Status: AC
  Administered 2018-09-12: 100 mg via ORAL
  Filled 2018-09-12: qty 1

## 2018-09-12 MED ORDER — ALBUTEROL SULFATE HFA 108 (90 BASE) MCG/ACT IN AERS
2.0000 | INHALATION_SPRAY | Freq: Once | RESPIRATORY_TRACT | Status: AC
  Administered 2018-09-12: 2 via RESPIRATORY_TRACT
  Filled 2018-09-12: qty 6.7

## 2018-09-12 MED ORDER — ONDANSETRON HCL 4 MG/2ML IJ SOLN
4.0000 mg | Freq: Once | INTRAMUSCULAR | Status: AC
  Administered 2018-09-12: 4 mg via INTRAVENOUS
  Filled 2018-09-12: qty 2

## 2018-09-12 MED ORDER — DOXYCYCLINE HYCLATE 100 MG PO CAPS: 100.0000 mg | ORAL_CAPSULE | Freq: Two times a day (BID) | ORAL | 0 refills | Status: DC

## 2018-09-12 NOTE — ED Notes (Signed)
Patient stated she is scared to get a chest xray because she thinks she might have lung cancer based on familial history. Patient requested that if the xray shows a lung tumor that she not be notified of the diagnosis. I told pt I would speak with the provider about situation.

## 2018-09-12 NOTE — ED Notes (Addendum)
Yolanda Miller: [phone] friend of pt's, may give ride home

## 2018-09-12 NOTE — ED Triage Notes (Signed)
Pt c/o of abdominal pain with n/d x 2 days with low grade fever

## 2018-09-12 NOTE — ED Notes (Signed)
Notified xray to get patient.

## 2018-09-12 NOTE — ED Notes (Signed)
Patient had flu symptoms for last 3 weeks. Severe nausea started today.

## 2018-09-13 NOTE — ED Provider Notes (Signed)
[facility] EMERGENCY DEPARTMENT Provider Note   CSN: [id number] Arrival date & time: 09/12/18  1741     History   Chief Complaint Chief Complaint  Patient presents with  . Abdominal Pain    HPI Yolanda Miller is a 63 y.o. female.   Shortness of Breath  Severity:  Mild Onset quality:  Gradual Duration:  2 weeks Timing:  Constant Progression:  Worsening Chronicity:  New Context: not activity   Relieved by:  None tried Worsened by:  Nothing Ineffective treatments:  None tried   Past Medical History:  Diagnosis Date  . Anxiety   . Depression   . Migraines   . Thyroid disease     Patient Active Problem List   Diagnosis Date Noted  . HYPOTHYROIDISM 03/24/2007  . HYPERTENSION 03/24/2007    Past Surgical History:  Procedure Laterality Date  . ABDOMINAL HYSTERECTOMY    . CESAREAN SECTION    . LEG SURGERY Left 2004  . THYROID CYST EXCISION    . THYROIDECTOMY     parathyroid as well     OB History   No obstetric history on file.      Home Medications    Prior to Admission medications   Medication Sig Start Date End Date Taking? Authorizing Provider  Borage Oil-GLA-Linoleic Acid (BORAGE PO) Take 1 tablet by mouth daily.    Yes [provider]  cholecalciferol (VITAMIN D) 1000 units tablet Take 1,000 Units by mouth daily.   Yes [provider]  levothyroxine (SYNTHROID, LEVOTHROID) 100 MCG tablet Take 100 mcg by mouth daily before breakfast.    Yes [provider]  amoxicillin-clavulanate (AUGMENTIN) 875-125 MG tablet Take 1 tablet by mouth 2 (two) times daily. Completed course--started on 08/30/2018 for 10 day course 08/30/18   [provider]  doxycycline (VIBRAMYCIN) 100 MG capsule Take 1 capsule (100 mg total) by mouth 2 (two) times daily. One po bid x 7 days 09/12/18   Janal Haak, Corene Cornea, MD  predniSONE (DELTASONE) 20 MG tablet Take 20 mg by mouth as directed.  08/30/18   [provider]    Family History Family  History  Problem Relation Age of Onset  . Cancer Mother   . Cancer Father   . Cancer Brother     Social History Social History   Tobacco Use  . Smoking status: Current Every Day Smoker    Packs/day: 0.50  . Smokeless tobacco: Never Used  Substance Use Topics  . Alcohol use: Not Currently    Comment: occasionally  . Drug use: Not on file     Allergies   Oxycodone-acetaminophen; Demerol [meperidine]; and Propoxyphene n-acetaminophen   Review of Systems Review of Systems  Respiratory: Positive for shortness of breath.   All other systems reviewed and are negative.    Physical Exam Updated Vital Signs BP 109/62   Pulse 91   Temp 99 F (37.2 C) (Oral)   Resp 18   Ht 5\' 4"  (1.626 m)   Wt 65.8 kg   SpO2 94%   BMI 24.89 kg/m   Physical Exam Vitals signs and nursing note reviewed.  Constitutional:      Appearance: She is well-developed.  HENT:     Head: Normocephalic and atraumatic.     Nose: Nose normal. No congestion or rhinorrhea.  Eyes:     Extraocular Movements: Extraocular movements intact.     Conjunctiva/sclera: Conjunctivae normal.     Pupils: Pupils are equal, round, and reactive to light.  Neck:  Musculoskeletal: Normal range of motion.  Cardiovascular:     Rate and Rhythm: Normal rate and regular rhythm.  Pulmonary:     Effort: No respiratory distress.     Breath sounds: No stridor. Wheezing present.  Abdominal:     General: Bowel sounds are normal. There is no distension.     Tenderness: There is no abdominal tenderness.  Musculoskeletal: Normal range of motion.        General: No swelling or tenderness.  Skin:    General: Skin is warm and dry.  Neurological:     Mental Status: She is alert.      ED Treatments / Results  Labs (all labs ordered are listed, but only abnormal results are displayed) Labs Reviewed  COMPREHENSIVE METABOLIC PANEL - Abnormal; Notable for the following components:      Result Value   Glucose, Bld 113 (*)     Calcium 8.8 (*)    All other components within normal limits  URINALYSIS, ROUTINE W REFLEX MICROSCOPIC - Abnormal; Notable for the following components:   Leukocytes,Ua TRACE (*)    All other components within normal limits  LIPASE, BLOOD  CBC    EKG None  Radiology Dg Chest 2 View  Result Date: 09/12/2018 CLINICAL DATA:  Cough and sore throat with dyspnea EXAM: CHEST - 2 VIEW COMPARISON:  Report from 12/21/2002 FINDINGS: The cardiopericardial silhouette is within normal limits for size. Nonaneurysmal atherosclerotic aorta at the arch. Subtle opacity in the right middle lobe distribution suspicious for small focus of pneumonia. No effusion or pulmonary edema. Mid to upper thoracic spondylosis. IMPRESSION: Subtle opacity in the right middle lobe distribution suspicious for pneumonia. Electronically Signed   By: Ashley Royalty M.D.   On: 09/12/2018 23:07    Procedures Procedures (including critical care time)  Medications Ordered in ED Medications  ipratropium-albuterol (DUONEB) 0.5-2.5 (3) MG/3ML nebulizer solution 3 mL (3 mLs Nebulization Given 09/12/18 2223)  sodium chloride flush (NS) 0.9 % injection 3 mL (3 mLs Intravenous Given 09/12/18 2113)  ondansetron (ZOFRAN) injection 4 mg (4 mg Intravenous Given 09/12/18 2145)  lactated ringers bolus 1,000 mL (0 mLs Intravenous Stopped 09/12/18 2247)  dexamethasone (DECADRON) injection 10 mg (10 mg Intravenous Given 09/12/18 2145)  ketorolac (TORADOL) 30 MG/ML injection 30 mg (30 mg Intravenous Given 09/12/18 2146)  doxycycline (VIBRA-TABS) tablet 100 mg (100 mg Oral Given 09/12/18 2339)  albuterol (PROVENTIL HFA;VENTOLIN HFA) 108 (90 Base) MCG/ACT inhaler 2 puff (2 puffs Inhalation Given 09/12/18 2339)     Initial Impression / Assessment and Plan / ED Course  I have reviewed the triage vital signs and the nursing notes.  Pertinent labs & imaging results that were available during my care of the patient were reviewed by me and considered in  my medical decision making (see chart for details).     Patient is a told triage she does have abdominal pain however my examination patient was more concerned about her cough and shortness of breath and was found to have wheezing consistent with likely bronchitis.  It was asymmetric so started on doxycycline for [facility] pneumonia.  Chest x-ray normal.  Patient will follow-up with primary doctor for further management.  Also will attempt to quit smoking.  Final Clinical Impressions(s) / ED Diagnoses   Final diagnoses:  SOB (shortness of breath)    ED Discharge Orders         Ordered    doxycycline (VIBRAMYCIN) 100 MG capsule  2 times daily  09/12/18 2316           Dorin Stooksbury, Corene Cornea, MD 09/16/18 9144

## 2018-10-11 ENCOUNTER — Ambulatory Visit (INDEPENDENT_AMBULATORY_CARE_PROVIDER_SITE_OTHER): Payer: BC Managed Care – PPO | Admitting: Otolaryngology

## 2018-10-11 DIAGNOSIS — J320 Chronic maxillary sinusitis: Secondary | ICD-10-CM

## 2018-10-11 DIAGNOSIS — J343 Hypertrophy of nasal turbinates: Secondary | ICD-10-CM

## 2018-10-11 DIAGNOSIS — J310 Chronic rhinitis: Secondary | ICD-10-CM

## 2018-10-11 DIAGNOSIS — R05 Cough: Secondary | ICD-10-CM | POA: Diagnosis not present

## 2018-10-15 ENCOUNTER — Emergency Department (HOSPITAL_COMMUNITY): Payer: BC Managed Care – PPO

## 2018-10-15 ENCOUNTER — Inpatient Hospital Stay (HOSPITAL_COMMUNITY)
Admission: EM | Admit: 2018-10-15 | Discharge: 2018-10-17 | DRG: 871 | Disposition: A | Discharge status: Home / Self Care | Payer: BC Managed Care – PPO | Attending: Internal Medicine | Admitting: Internal Medicine

## 2018-10-15 ENCOUNTER — Encounter (HOSPITAL_COMMUNITY): Payer: Self-pay | Admitting: Emergency Medicine

## 2018-10-15 ENCOUNTER — Other Ambulatory Visit: Payer: Self-pay

## 2018-10-15 DIAGNOSIS — Z720 Tobacco use: Secondary | ICD-10-CM | POA: Diagnosis not present

## 2018-10-15 DIAGNOSIS — F17210 Nicotine dependence, cigarettes, uncomplicated: Secondary | ICD-10-CM | POA: Diagnosis present

## 2018-10-15 DIAGNOSIS — Z79899 Other long term (current) drug therapy: Secondary | ICD-10-CM | POA: Diagnosis not present

## 2018-10-15 DIAGNOSIS — F329 Major depressive disorder, single episode, unspecified: Secondary | ICD-10-CM | POA: Diagnosis present

## 2018-10-15 DIAGNOSIS — I10 Essential (primary) hypertension: Secondary | ICD-10-CM | POA: Diagnosis present

## 2018-10-15 DIAGNOSIS — Z888 Allergy status to other drugs, medicaments and biological substances status: Secondary | ICD-10-CM

## 2018-10-15 DIAGNOSIS — J069 Acute upper respiratory infection, unspecified: Secondary | ICD-10-CM

## 2018-10-15 DIAGNOSIS — J440 Chronic obstructive pulmonary disease with acute lower respiratory infection: Secondary | ICD-10-CM | POA: Diagnosis not present

## 2018-10-15 DIAGNOSIS — Z20822 Contact with and (suspected) exposure to covid-19: Secondary | ICD-10-CM | POA: Diagnosis present

## 2018-10-15 DIAGNOSIS — E039 Hypothyroidism, unspecified: Secondary | ICD-10-CM | POA: Diagnosis not present

## 2018-10-15 DIAGNOSIS — Z716 Tobacco abuse counseling: Secondary | ICD-10-CM | POA: Diagnosis not present

## 2018-10-15 DIAGNOSIS — Z20828 Contact with and (suspected) exposure to other viral communicable diseases: Secondary | ICD-10-CM | POA: Diagnosis present

## 2018-10-15 DIAGNOSIS — F419 Anxiety disorder, unspecified: Secondary | ICD-10-CM | POA: Diagnosis not present

## 2018-10-15 DIAGNOSIS — Z79890 Hormone replacement therapy (postmenopausal): Secondary | ICD-10-CM | POA: Diagnosis not present

## 2018-10-15 DIAGNOSIS — Z885 Allergy status to narcotic agent status: Secondary | ICD-10-CM

## 2018-10-15 DIAGNOSIS — J181 Lobar pneumonia, unspecified organism: Secondary | ICD-10-CM | POA: Diagnosis not present

## 2018-10-15 DIAGNOSIS — A419 Sepsis, unspecified organism: Principal | ICD-10-CM | POA: Diagnosis present

## 2018-10-15 DIAGNOSIS — Z7951 Long term (current) use of inhaled steroids: Secondary | ICD-10-CM

## 2018-10-15 DIAGNOSIS — J189 Pneumonia, unspecified organism: Secondary | ICD-10-CM | POA: Diagnosis present

## 2018-10-15 DIAGNOSIS — Z90710 Acquired absence of both cervix and uterus: Secondary | ICD-10-CM | POA: Diagnosis not present

## 2018-10-15 LAB — COMPREHENSIVE METABOLIC PANEL
ALT: 15 U/L (ref 0–44)
ANION GAP: 11 (ref 5–15)
AST: 14 U/L — ABNORMAL LOW (ref 15–41)
Albumin: 4.5 g/dL (ref 3.5–5.0)
Alkaline Phosphatase: 70 U/L (ref 38–126)
BUN: 13 mg/dL (ref 8–23)
CO2: 24 mmol/L (ref 22–32)
Calcium: 9.4 mg/dL (ref 8.9–10.3)
Chloride: 101 mmol/L (ref 98–111)
Creatinine, Ser: 0.66 mg/dL (ref 0.44–1.00)
GFR calc Af Amer: >60 mL/min (ref >60)
GFR calc non Af Amer: >60 mL/min (ref >60)
Glucose, Bld: 106 mg/dL — ABNORMAL HIGH (ref 70–99)
Potassium: 3.6 mmol/L (ref 3.5–5.1)
Sodium: 136 mmol/L (ref 135–145)
Total Bilirubin: 0.4 mg/dL (ref 0.3–1.2)
Total Protein: 8.3 g/dL — ABNORMAL HIGH (ref 6.5–8.1)

## 2018-10-15 LAB — CBC
HCT: 37.7 % (ref 36.0–46.0)
Hemoglobin: 12.1 g/dL (ref 12.0–15.0)
MCH: 30.8 pg (ref 26.0–34.0)
MCHC: 32.1 g/dL (ref 30.0–36.0)
MCV: 95.9 fL (ref 80.0–100.0)
PLATELETS: 378 10*3/uL (ref 150–400)
RBC: 3.93 MIL/uL (ref 3.87–5.11)
RDW: 12.5 % (ref 11.5–15.5)
WBC: 12.4 10*3/uL — ABNORMAL HIGH (ref 4.0–10.5)
nRBC: 0 % (ref 0.0–0.2)

## 2018-10-15 LAB — DIFFERENTIAL
Abs Immature Granulocytes: 0.05 10*3/uL (ref 0.00–0.07)
Basophils Absolute: 0 10*3/uL (ref 0.0–0.1)
Basophils Relative: 0 %
Eosinophils Absolute: 0.1 10*3/uL (ref 0.0–0.5)
Eosinophils Relative: 1 %
Immature Granulocytes: 0 %
Lymphocytes Relative: 37 %
Lymphs Abs: 4.6 10*3/uL — ABNORMAL HIGH (ref 0.7–4.0)
Monocytes Absolute: 1 10*3/uL (ref 0.1–1.0)
Monocytes Relative: 8 %
Neutro Abs: 6.7 10*3/uL (ref 1.7–7.7)
Neutrophils Relative %: 54 %

## 2018-10-15 LAB — INFLUENZA PANEL BY PCR (TYPE A & B)
Influenza A By PCR: NEGATIVE
Influenza B By PCR: NEGATIVE

## 2018-10-15 LAB — PROCALCITONIN: Procalcitonin: <0.1 ng/mL

## 2018-10-15 MED ORDER — ONDANSETRON HCL 4 MG/2ML IJ SOLN: 4.0000 mg | Freq: Four times a day (QID) | INTRAMUSCULAR | Status: DC | PRN

## 2018-10-15 MED ORDER — POLYETHYLENE GLYCOL 3350 17 G PO PACK
17.0000 g | PACK | Freq: Every day | ORAL | Status: DC | PRN
  Filled 2018-10-15: qty 1

## 2018-10-15 MED ORDER — ACETAMINOPHEN 650 MG RE SUPP: 650.0000 mg | Freq: Four times a day (QID) | RECTAL | Status: DC | PRN

## 2018-10-15 MED ORDER — ACETAMINOPHEN 325 MG PO TABS
650.0000 mg | ORAL_TABLET | Freq: Four times a day (QID) | ORAL | Status: DC | PRN
  Administered 2018-10-15 – 2018-10-17 (×4): 650 mg via ORAL
  Filled 2018-10-15 (×4): qty 2

## 2018-10-15 MED ORDER — SODIUM CHLORIDE 0.9 % IV BOLUS
1000.0000 mL | Freq: Once | INTRAVENOUS | Status: AC
  Administered 2018-10-15: 1000 mL via INTRAVENOUS

## 2018-10-15 MED ORDER — ONDANSETRON HCL 4 MG PO TABS: 4.0000 mg | ORAL_TABLET | Freq: Four times a day (QID) | ORAL | Status: DC | PRN

## 2018-10-15 MED ORDER — SODIUM CHLORIDE 0.9 % IV SOLN
2.0000 g | Freq: Three times a day (TID) | INTRAVENOUS | Status: DC
  Administered 2018-10-16 – 2018-10-17 (×4): 2 g via INTRAVENOUS
  Filled 2018-10-15 (×6): qty 2

## 2018-10-15 MED ORDER — VANCOMYCIN HCL 10 G IV SOLR
1500.0000 mg | Freq: Once | INTRAVENOUS | Status: AC
  Administered 2018-10-15: 1500 mg via INTRAVENOUS
  Filled 2018-10-15 (×2): qty 1500

## 2018-10-15 MED ORDER — ENOXAPARIN SODIUM 40 MG/0.4ML ~~LOC~~ SOLN
40.0000 mg | SUBCUTANEOUS | Status: DC
  Administered 2018-10-15 – 2018-10-16 (×2): 40 mg via SUBCUTANEOUS
  Filled 2018-10-15 (×2): qty 0.4

## 2018-10-15 MED ORDER — ALBUTEROL SULFATE HFA 108 (90 BASE) MCG/ACT IN AERS
2.0000 | INHALATION_SPRAY | RESPIRATORY_TRACT | Status: DC | PRN
  Filled 2018-10-15: qty 6.7

## 2018-10-15 MED ORDER — VANCOMYCIN HCL IN DEXTROSE 750-5 MG/150ML-% IV SOLN
750.0000 mg | Freq: Two times a day (BID) | INTRAVENOUS | Status: DC
  Administered 2018-10-16: 750 mg via INTRAVENOUS
  Filled 2018-10-15 (×2): qty 150

## 2018-10-15 MED ORDER — SODIUM CHLORIDE 0.9 % IV SOLN
2.0000 g | Freq: Once | INTRAVENOUS | Status: AC
  Administered 2018-10-15: 2 g via INTRAVENOUS
  Filled 2018-10-15: qty 2

## 2018-10-15 MED ORDER — ALBUTEROL SULFATE HFA 108 (90 BASE) MCG/ACT IN AERS
2.0000 | INHALATION_SPRAY | RESPIRATORY_TRACT | Status: DC
  Administered 2018-10-15 (×2): 2 via RESPIRATORY_TRACT
  Filled 2018-10-15: qty 6.7

## 2018-10-15 NOTE — ED Provider Notes (Signed)
[facility] Provider Note   CSN: [id number] Arrival date & time: 10/15/18  1223    History   Chief Complaint Chief Complaint  Patient presents with  . Shortness of Breath    HPI Yolanda Miller is a 63 y.o. female.     Level 5 caveat for urgent need for intervention.  Chief complaint cough and shortness of breath.  Patient was seen in the emergency department approximately 2 weeks ago and diagnosed with pneumonia.  She was started on doxycycline.  Symptoms did not improve and she consulted an ENT physician on Monday.  Antibiotic was started beginning with the letter L.  She continues to have cough and shortness of breath.  Additionally, on 09/25/2018 she was exposed to an individual with Covaid-19 at a winery function.  The other individual was a singer and she was sitting at a table approximately 6 feet away.     Past Medical History:  Diagnosis Date  . Anxiety   . Depression   . Migraines   . Thyroid disease     Patient Active Problem List   Diagnosis Date Noted  . HYPOTHYROIDISM 03/24/2007  . HYPERTENSION 03/24/2007    Past Surgical History:  Procedure Laterality Date  . ABDOMINAL HYSTERECTOMY    . CESAREAN SECTION    . LEG SURGERY Left 2004  . THYROID CYST EXCISION    . THYROIDECTOMY     parathyroid as well     OB History   No obstetric history on file.      Home Medications    Prior to Admission medications   Medication Sig Start Date End Date Taking? Authorizing Provider  Borage Oil-GLA-Linoleic Acid (BORAGE PO) Take 1 tablet by mouth daily.     [provider]  cholecalciferol (VITAMIN D) 1000 units tablet Take 1,000 Units by mouth daily.    [provider]  doxycycline (VIBRAMYCIN) 100 MG capsule Take 1 capsule (100 mg total) by mouth 2 (two) times daily. One po bid x 7 days Patient not taking: Reported on 10/15/2018 09/12/18   Mesner, Corene Cornea, MD  levothyroxine (SYNTHROID, LEVOTHROID) 100 MCG tablet Take 100 mcg  by mouth daily before breakfast.     [provider]  predniSONE (DELTASONE) 20 MG tablet Take 20 mg by mouth as directed.  08/30/18   [provider]  predniSONE (STERAPRED UNI-PAK 21 TAB) 10 MG (21) TBPK tablet  10/11/18   [provider]    Family History Family History  Problem Relation Age of Onset  . Cancer Mother   . Cancer Father   . Cancer Brother     Social History Social History   Tobacco Use  . Smoking status: Current Every Day Smoker    Packs/day: 0.50  . Smokeless tobacco: Never Used  Substance Use Topics  . Alcohol use: Not Currently    Comment: occasionally  . Drug use: Not on file     Allergies   Oxycodone-acetaminophen; Demerol [meperidine]; and Propoxyphene n-acetaminophen   Review of Systems Review of Systems  Unable to perform ROS: Acuity of condition     Physical Exam Updated Vital Signs BP 132/83   Pulse 70   Temp 98.1 F (36.7 C) (Oral)   Resp 16   Ht 5\' 4"  (1.626 m)   Wt 74.4 kg   SpO2 100%   BMI 28.15 kg/m   Physical Exam Vitals signs and nursing note reviewed.  Constitutional:      Appearance: She is well-developed.  Comments: Slightly dyspneic, but no extremis.  HENT:     Head: Normocephalic and atraumatic.  Eyes:     Conjunctiva/sclera: Conjunctivae normal.  Neck:     Musculoskeletal: Neck supple.  Cardiovascular:     Rate and Rhythm: Normal rate and regular rhythm.  Pulmonary:     Effort: Pulmonary effort is normal.     Comments: Scattered rhonchi. Abdominal:     General: Bowel sounds are normal.     Palpations: Abdomen is soft.  Musculoskeletal: Normal range of motion.  Skin:    General: Skin is warm and dry.  Neurological:     Mental Status: She is alert and oriented to person, place, and time.  Psychiatric:        Behavior: Behavior normal.      ED Treatments / Results  Labs (all labs ordered are listed, but only abnormal results are displayed) Labs Reviewed  NOVEL  CORONAVIRUS, NAA (HOSPITAL ORDER, SEND-OUT TO REF LAB)  RESPIRATORY PANEL BY PCR  CULTURE, BLOOD (ROUTINE X 2)  CULTURE, BLOOD (ROUTINE X 2)  INFLUENZA PANEL BY PCR (TYPE A & B)  PROCALCITONIN  PROCALCITONIN  CBC  COMPREHENSIVE METABOLIC PANEL    EKG None  Radiology Dg Chest Portable 1 View  Result Date: 10/15/2018 CLINICAL DATA:  Shortness of breath and cough EXAM: PORTABLE CHEST 1 VIEW COMPARISON:  September 12, 2018 FINDINGS: There is consolidation in the apical segment of the left upper lobe with volume loss. Lungs elsewhere are clear. Heart size and pulmonary vascularity are normal. No adenopathy. No bone lesions. IMPRESSION: Consolidation with volume loss in the apical segment of the left upper lobe. Lungs elsewhere clear. Heart size normal. No adenopathy evident. Followup PA and lateral chest radiographs recommended in 3-4 weeks following trial of antibiotic therapy to ensure resolution and exclude underlying malignancy. Electronically Signed   By: Lowella Grip III M.D.   On: 10/15/2018 14:36    Procedures Procedures (including critical care time)  Medications Ordered in ED Medications  albuterol (PROVENTIL HFA;VENTOLIN HFA) 108 (90 Base) MCG/ACT inhaler 2 puff (2 puffs Inhalation Given 10/15/18 1711)  sodium chloride 0.9 % bolus 1,000 mL (has no administration in time range)     Initial Impression / Assessment and Plan / ED Course  I have reviewed the triage vital signs and the nursing notes.  Pertinent labs & imaging results that were available during my care of the patient were reviewed by me and considered in my medical decision making (see chart for details).        Patient presents with persistent and worsening cough and dyspnea despite 2 rounds of antibiotics.  Additionally, she had a Covaid-19 exposure on 09/25/2018.  Portable chest x-ray reveals a consolidation in the apical segment of the left upper lobe volume loss.  Influenza A and B neg. Covaid-19 pending.   Will get blood cultures, initiate IV Vanco and IV Maxipime.  Admit to general medicine.   CRITICAL CARE Performed by: Nat Christen Total critical care time: 30 minutes Critical care time was exclusive of separately billable procedures and treating other patients. Critical care was necessary to treat or prevent imminent or life-threatening deterioration. Critical care was time spent personally by me on the following activities: development of treatment plan with patient and/or surrogate as well as nursing, discussions with consultants, evaluation of patient's response to treatment, examination of patient, obtaining history from patient or surrogate, ordering and performing treatments and interventions, ordering and review of laboratory studies, ordering and review of  radiographic studies, pulse oximetry and re-evaluation of patient's condition.  Final Clinical Impressions(s) / ED Diagnoses   Final diagnoses:  Upper respiratory tract infection, unspecified type    ED Discharge Orders    None       Nat Christen, MD 10/15/18 1758

## 2018-10-15 NOTE — H&P (Addendum)
History and Physical    Yolanda Miller XBJ:[id number] DOB: 1955/10/10 DOA: 10/15/2018  PCP: [facility]   Patient coming from: Home  I have personally briefly reviewed patient's old medical records in [place]  Chief Complaint: SOB, Cough  HPI: Yolanda Miller is a 63 y.o. female with medical history significant for thyroidism and hypertension who presented to the ED with complaints of progressive difficulty breathing and dry cough that started over a month ago.  Patient reports initial fevers up to 101 at home, feeling that there was something in her throat that "it was getting bigger", nasal congestion, no body aches.  She saw her primary care provider and was prescribed a course of antibiotics she does not know the name.  She completed course.  Did not feel better, came to the ED 2/16 for difficulty breathing, cough, abdominal pain was wheezing, chest xray - suggested pneumonia. She was sent home on a course of doxycycline which she completed. She saw her ENT doctor on Monday 3/16 was prescribed a 7 day course of an antibiotic "name starts with L " with prednisone, she is on day 4.  Patient reported that on 09/25/18-she attended a [place], in [place], and sat at a table next to the singer who performed, and has now been diagnosed with " Co-vid 19 and is currently on admission at [facility].. Daughter of the patient reached out to her.  She did not know any other person at the function and if others have been diagnosed.  Considering sick contact, and persistence of symptoms- difficulty breathing and cough with no relief with antibiotics, she presented to the ED today. Since contact with confirmed "Co-Vid patient" she denies having any flulike symptoms.  ED Course: Stable vitals. Temp 98.1. WBC, CMP pending.  Chest x-ray showed consolidation with volume loss in the apical aspect of the left upper lobe.  Patient was started on IV Vanco and cefepime  in the ED hospitalist to admit for pneumonia or failure of outpatient therapy.  Review of Systems: As per HPI all other systems reviewed and negative.  Past Medical History:  Diagnosis Date   Anxiety    Depression    Migraines    Thyroid disease     Past Surgical History:  Procedure Laterality Date   ABDOMINAL HYSTERECTOMY     CESAREAN SECTION     LEG SURGERY Left 2004   THYROID CYST EXCISION     THYROIDECTOMY     parathyroid as well     reports that she has been smoking. She has been smoking about 0.50 packs per day. She has never used smokeless tobacco. She reports previous alcohol use. No history on file for drug.  Allergies  Allergen Reactions   Oxycodone-Acetaminophen Itching and Rash   Demerol [Meperidine] Itching   Propoxyphene N-Acetaminophen     Family History  Problem Relation Age of Onset   Cancer Mother    Cancer Father    Cancer Brother     Prior to Admission medications   Medication Sig Start Date End Date Taking? Authorizing Provider  Borage Oil-GLA-Linoleic Acid (BORAGE PO) Take 1 tablet by mouth daily.     [provider]  cholecalciferol (VITAMIN D) 1000 units tablet Take 1,000 Units by mouth daily.    [provider]  doxycycline (VIBRAMYCIN) 100 MG capsule Take 1 capsule (100 mg total) by mouth 2 (two) times daily. One po bid x 7 days Patient not taking: Reported on 10/15/2018  09/12/18   Mesner, Corene Cornea, MD  levothyroxine (SYNTHROID, LEVOTHROID) 100 MCG tablet Take 100 mcg by mouth daily before breakfast.     [provider]  predniSONE (DELTASONE) 20 MG tablet Take 20 mg by mouth as directed.  08/30/18   [provider]  predniSONE (STERAPRED UNI-PAK 21 TAB) 10 MG (21) TBPK tablet  10/11/18   [provider]    Physical Exam: Vitals:   10/15/18 1430 10/15/18 1500 10/15/18 1530 10/15/18 1600  BP: 134/74 131/86 124/71 132/83  Pulse: 73 81 64 70  Resp: 18 15 15 16   Temp:      TempSrc:        SpO2: 97% 98% 97% 100%  Weight:      Height:        Constitutional: calm, comfortable, became anxious and tearful Vitals:   10/15/18 1430 10/15/18 1500 10/15/18 1530 10/15/18 1600  BP: 134/74 131/86 124/71 132/83  Pulse: 73 81 64 70  Resp: 18 15 15 16   Temp:      TempSrc:      SpO2: 97% 98% 97% 100%  Weight:      Height:       Eyes: PERRL, lids and conjunctivae normal ENMT: Mucous membranes are moist. Posterior pharynx clear of any exudate or lesions. Neck: normal, supple, no masses, no thyromegaly Respiratory:  Normal respiratory effort. No accessory muscle use. Unable to auscultate. Cardiovascular: Regular rate and rhythm, No extremity edema. 2+ pedal pulses.   Abdomen: no tenderness, no masses palpated. No hepatosplenomegaly. Bowel sounds positive.  Musculoskeletal: no clubbing / cyanosis. No joint deformity upper and lower extremities. Good ROM, no contractures. Normal muscle tone.  Skin: no rashes, lesions, ulcers. No induration Neurologic: CN 2-12 grossly intact.  Strength 5/5 in all 4.  Psychiatric: Normal judgment and insight. Alert and oriented x 3. Normal mood.   Labs on Admission: I have personally reviewed following labs and imaging studies  Radiological Exams on Admission: Dg Chest Portable 1 View  Result Date: 10/15/2018 CLINICAL DATA:  Shortness of breath and cough EXAM: PORTABLE CHEST 1 VIEW COMPARISON:  September 12, 2018 FINDINGS: There is consolidation in the apical segment of the left upper lobe with volume loss. Lungs elsewhere are clear. Heart size and pulmonary vascularity are normal. No adenopathy. No bone lesions. IMPRESSION: Consolidation with volume loss in the apical segment of the left upper lobe. Lungs elsewhere clear. Heart size normal. No adenopathy evident. Followup PA and lateral chest radiographs recommended in 3-4 weeks following trial of antibiotic therapy to ensure resolution and exclude underlying malignancy. Electronically Signed   By: Lowella Grip III M.D.   On: 10/15/2018 14:36   EKG: None.  Assessment/Plan Active Problems:   Pneumonia  Pneumonia- SOB, dry cough, contact with confirmed Co-vid 19 ill persons.  Flu-like symptoms well preceded contact with sick persons.  Mild leukocytosis 12.4, with marginally increased absolute lymphocytes. Patient rules out for sepsis.  Influenza panel negative chest x-ray today shows consolidation with volume loss in the apical segment of the left upper lobe.  On my view chest x-ray appears slightly worse. - Considering failure of outpatient antibiotics- currently on third course, will admit for IV antibiotics, IV vancomycin and cefepime -Co-vid testing ordered in ED pending -Add on respiratory virus panel -Procalcitonin - f/u blood cultures - Airborne, Contact precautions  Hypothyroidism-  -Pending med reconciliation resume home Synthroid  Hypertension- stable, not on home antihypertensive.  Depression, anxiety-patient anxious and tearful, worried about cats at home.  -  Trazodone PRN for sleep  HIV as part of routine health screening   DVT prophylaxis: Lovenox Code Status: Full  Family Communication: None at bedside Disposition Plan: Per rounding team Consults called: None Admission status: Inpt, med-surg.  I certify that at the point of admission it is my clinical judgment that the patient will require inpatient hospital care spanning beyond 2 midnights from the point of admission due to high intensity of service, high risk for further deterioration and high frequency of surveillance required. The following factors support the patient status of inpatient.  - Failure of outpatient antibiotics,  awaiting blood culture and Virology results.    Bethena Roys MD [facility]  10/15/2018, 5:51 PM

## 2018-10-15 NOTE — Progress Notes (Signed)
Pharmacy Antibiotic Note  Yolanda Miller is a 63 y.o. female admitted on 10/15/2018 with pneumonia.  Pharmacy has been consulted for Vancomycin and Cefepime dosing.  Plan: Vancomycin 1500 mg IV x 1 dose. Vancomycin 750 mg IV every 12 hours.  Goal trough 15-20 mcg/mL.  Cefepime 2000 mg IV every 8 hours. Monitor labs, c/s, and vanco levels as indicated.  Height: 5\' 4"  (162.6 cm) Weight: 164 lb (74.4 kg) IBW/kg (Calculated) : 54.7  Temp (24hrs), Avg:98.1 F (36.7 C), Min:98.1 F (36.7 C), Max:98.1 F (36.7 C)  Recent Labs  Lab 10/15/18 1821  WBC 12.4*  CREATININE 0.66    Estimated Creatinine Clearance: 73 mL/min (by C-G formula based on SCr of 0.66 mg/dL).    Allergies  Allergen Reactions  . Oxycodone-Acetaminophen Itching and Rash  . Demerol [Meperidine] Itching  . Propoxyphene N-Acetaminophen     Antimicrobials this admission: Vanco 3/20 >>  Cefepime 3/20 >>   Dose adjustments this admission: N/A  Microbiology results: 3/20 BCx: pending 3/20 COVID-19: pending    Thank you for allowing pharmacy to be a part of this patient's care.  Ramond Craver 10/15/2018 8:27 PM

## 2018-10-15 NOTE — ED Triage Notes (Signed)
Pt was evaluated in ED a couple weeks ago and diagnosed with pneumonia. States she completed antibiotic therapy with no improvement. Pt saw ENT on Monday who prescribed more antibiotics and prednisone. Pt reports continued SOB and cough. Denies fever at this time. Pt stated she went to a church function on 2/29 and was exposed to someone who was just diagnosed with COVID-19 and is admitted at [facility].

## 2018-10-16 DIAGNOSIS — J189 Pneumonia, unspecified organism: Secondary | ICD-10-CM

## 2018-10-16 LAB — MRSA PCR SCREENING: MRSA by PCR: NEGATIVE

## 2018-10-16 MED ORDER — GUAIFENESIN ER 600 MG PO TB12
1200.0000 mg | ORAL_TABLET | Freq: Two times a day (BID) | ORAL | Status: DC | PRN
  Administered 2018-10-16 – 2018-10-17 (×3): 1200 mg via ORAL
  Filled 2018-10-16 (×3): qty 2

## 2018-10-16 MED ORDER — ALBUTEROL SULFATE HFA 108 (90 BASE) MCG/ACT IN AERS
1.0000 | INHALATION_SPRAY | Freq: Four times a day (QID) | RESPIRATORY_TRACT | Status: DC
  Administered 2018-10-16 – 2018-10-17 (×4): 2 via RESPIRATORY_TRACT
  Filled 2018-10-16: qty 6.7

## 2018-10-16 MED ORDER — VITAMIN D 25 MCG (1000 UNIT) PO TABS
1000.0000 [IU] | ORAL_TABLET | Freq: Every day | ORAL | Status: DC
  Administered 2018-10-16 – 2018-10-17 (×2): 1000 [IU] via ORAL
  Filled 2018-10-16 (×2): qty 1

## 2018-10-16 MED ORDER — BORAGE 1000-240-378 MG PO CAPS: ORAL_CAPSULE | Freq: Every day | ORAL | Status: DC

## 2018-10-16 MED ORDER — SODIUM CHLORIDE 0.9 % IV SOLN
100.0000 mg | Freq: Two times a day (BID) | INTRAVENOUS | Status: DC
  Administered 2018-10-16 – 2018-10-17 (×2): 100 mg via INTRAVENOUS
  Filled 2018-10-16 (×3): qty 100

## 2018-10-16 MED ORDER — LEVOTHYROXINE SODIUM 100 MCG PO TABS: 100.0000 ug | ORAL_TABLET | Freq: Every day | ORAL | Status: DC

## 2018-10-16 MED ORDER — LEVOTHYROXINE SODIUM 100 MCG PO TABS
100.0000 ug | ORAL_TABLET | Freq: Every day | ORAL | Status: DC
  Administered 2018-10-16 – 2018-10-17 (×2): 100 ug via ORAL
  Filled 2018-10-16 (×2): qty 1

## 2018-10-16 NOTE — ED Notes (Signed)
ED TO INPATIENT HANDOFF REPORT  ED Nurse Name and Phone #: Barbaraann Faster RN [phone]  S Name/Age/Gender Cyndra Numbers 63 y.o. female Room/Bed: APA10/APA10  Code Status   Code Status: Full Code  Home/SNF/Other Home Patient oriented to: self Is this baseline? Yes   Triage Complete: Triage complete  Chief Complaint SOB X 2 WKS  Triage Note Pt was evaluated in ED a couple weeks ago and diagnosed with pneumonia. States she completed antibiotic therapy with no improvement. Pt saw ENT on Monday who prescribed more antibiotics and prednisone. Pt reports continued SOB and cough. Denies fever at this time. Pt stated she went to a church function on 2/29 and was exposed to someone who was just diagnosed with COVID-19 and is admitted at [facility].   Allergies Allergies  Allergen Reactions  . Oxycodone-Acetaminophen Itching and Rash  . Demerol [Meperidine] Itching  . Propoxyphene N-Acetaminophen     Level of Care/Admitting Diagnosis ED Disposition    ED Disposition Condition Comment   Admit  Hospital Area: [facility] [[id number]]  Level of Care: Med-Surg [16]  Diagnosis: Pneumonia [027253]  Admitting Physician: Bethena Roys [[id number]]  Attending Physician: Bethena Roys [id number]  Estimated length of stay: past midnight tomorrow  Certification:: I certify this patient will need inpatient services for at least 2 midnights  Bed request comments: High Risk Co-Vid 19 rule out.  PT Class (Do Not Modify): Inpatient [101]  PT Acc Code (Do Not Modify): Private [1]       B Medical/Surgery History Past Medical History:  Diagnosis Date  . Anxiety   . Depression   . Migraines   . Thyroid disease    Past Surgical History:  Procedure Laterality Date  . ABDOMINAL HYSTERECTOMY    . CESAREAN SECTION    . LEG SURGERY Left 2004  . THYROID CYST EXCISION    . THYROIDECTOMY     parathyroid as well     A IV Location/Drains/Wounds Patient  Lines/Drains/Airways Status   Active Line/Drains/Airways    Name:   Placement date:   Placement time:   Site:   Days:   Peripheral IV 10/15/18 Right Antecubital   10/15/18    1856    Antecubital   1   Peripheral IV 10/15/18 Left Antecubital   10/15/18    1908    Antecubital   1          Intake/Output Last 24 hours  Intake/Output Summary (Last 24 hours) at 10/16/2018 0010 Last data filed at 10/15/2018 2119 Gross per 24 hour  Intake 1597.57 ml  Output -  Net 1597.57 ml    Labs/Imaging Results for orders placed or performed during the hospital encounter of 10/15/18 (from the past 48 hour(s))  Influenza panel by PCR (type A & B)     Status: None   Collection Time: 10/15/18  1:49 PM  Result Value Ref Range   Influenza A By PCR NEGATIVE NEGATIVE   Influenza B By PCR NEGATIVE NEGATIVE    Comment: (NOTE) The Xpert Xpress Flu assay is intended as an aid in the diagnosis of  influenza and should not be used as a sole basis for treatment.  This  assay is FDA approved for nasopharyngeal swab specimens only. Nasal  washings and aspirates are unacceptable for Xpert Xpress Flu testing. Performed at [facility], [address] [place], [place] [address]   Procalcitonin - Baseline     Status: None   Collection Time: 10/15/18  6:21  PM  Result Value Ref Range   Procalcitonin <0.10 ng/mL    Comment:        Interpretation: PCT (Procalcitonin) <= 0.5 ng/mL: Systemic infection (sepsis) is not likely. Local bacterial infection is possible. (NOTE)       Sepsis PCT Algorithm           Lower Respiratory Tract                                      Infection PCT Algorithm    ----------------------------     ----------------------------         PCT < 0.25 ng/mL                PCT < 0.10 ng/mL         Strongly encourage             Strongly discourage   discontinuation of antibiotics    initiation of antibiotics    ----------------------------     -----------------------------       PCT 0.25 - 0.50  ng/mL            PCT 0.10 - 0.25 ng/mL               OR       >80% decrease in PCT            Discourage initiation of                                            antibiotics      Encourage discontinuation           of antibiotics    ----------------------------     -----------------------------         PCT >= 0.50 ng/mL              PCT 0.26 - 0.50 ng/mL               AND        <80% decrease in PCT             Encourage initiation of                                             antibiotics       Encourage continuation           of antibiotics    ----------------------------     -----------------------------        PCT >= 0.50 ng/mL                  PCT > 0.50 ng/mL               AND         increase in PCT                  Strongly encourage                                      initiation of antibiotics    Strongly encourage escalation  of antibiotics                                     -----------------------------                                           PCT <= 0.25 ng/mL                                                 OR                                        > 80% decrease in PCT                                     Discontinue / Do not initiate                                             antibiotics Performed at [facility], [address] [place] [address]   CBC     Status: Abnormal   Collection Time: 10/15/18  6:21 PM  Result Value Ref Range   WBC 12.4 (H) 4.0 - 10.5 K/uL   RBC 3.93 3.87 - 5.11 MIL/uL   Hemoglobin 12.1 12.0 - 15.0 g/dL   HCT 37.7 36.0 - 46.0 %   MCV 95.9 80.0 - 100.0 fL   MCH 30.8 26.0 - 34.0 pg   MCHC 32.1 30.0 - 36.0 g/dL   RDW 12.5 11.5 - 15.5 %   Platelets 378 150 - 400 K/uL   nRBC 0.0 0.0 - 0.2 %    Comment: Performed at [facility], [address], [place], [place] [address]  Comprehensive metabolic panel     Status: Abnormal   Collection Time: 10/15/18  6:21 PM  Result Value Ref Range   Sodium 136 135 - 145 mmol/L    Potassium 3.6 3.5 - 5.1 mmol/L   Chloride 101 98 - 111 mmol/L   CO2 24 22 - 32 mmol/L   Glucose, Bld 106 (H) 70 - 99 mg/dL   BUN 13 8 - 23 mg/dL   Creatinine, Ser 0.66 0.44 - 1.00 mg/dL   Calcium 9.4 8.9 - 10.3 mg/dL   Total Protein 8.3 (H) 6.5 - 8.1 g/dL   Albumin 4.5 3.5 - 5.0 g/dL   AST 14 (L) 15 - 41 U/L   ALT 15 0 - 44 U/L   Alkaline Phosphatase 70 38 - 126 U/L   Total Bilirubin 0.4 0.3 - 1.2 mg/dL   GFR calc non Af Amer >60 >60 mL/min   GFR calc Af Amer >60 >60 mL/min   Anion gap 11 5 - 15    Comment: Performed at [facility], [address] [place], [place] [address]  Differential     Status: Abnormal   Collection Time: 10/15/18  6:21 PM  Result Value Ref Range  Neutrophils Relative % 54 %   Neutro Abs 6.7 1.7 - 7.7 K/uL   Lymphocytes Relative 37 %   Lymphs Abs 4.6 (H) 0.7 - 4.0 K/uL   Monocytes Relative 8 %   Monocytes Absolute 1.0 0.1 - 1.0 K/uL   Eosinophils Relative 1 %   Eosinophils Absolute 0.1 0.0 - 0.5 K/uL   Basophils Relative 0 %   Basophils Absolute 0.0 0.0 - 0.1 K/uL   Immature Granulocytes 0 %   Abs Immature Granulocytes 0.05 0.00 - 0.07 K/uL    Comment: Performed at [facility], [address], [place], [place] [address]  Blood culture (routine x 2)     Status: None (Preliminary result)   Collection Time: 10/15/18  6:22 PM  Result Value Ref Range   Specimen Description LEFT ANTECUBITAL    Special Requests      BOTTLES DRAWN AEROBIC AND ANAEROBIC Blood Culture adequate volume Performed at [facility], [address] [place], [place] [address]    Culture PENDING    Report Status PENDING   Blood culture (routine x 2)     Status: None (Preliminary result)   Collection Time: 10/15/18  6:22 PM  Result Value Ref Range   Specimen Description RIGHT ANTECUBITAL    Special Requests      BOTTLES DRAWN AEROBIC AND ANAEROBIC Blood Culture adequate volume Performed at [facility], [address], [place], [place] [address]    Culture PENDING    Report  Status PENDING    Dg Chest Portable 1 View  Result Date: 10/15/2018 CLINICAL DATA:  Shortness of breath and cough EXAM: PORTABLE CHEST 1 VIEW COMPARISON:  September 12, 2018 FINDINGS: There is consolidation in the apical segment of the left upper lobe with volume loss. Lungs elsewhere are clear. Heart size and pulmonary vascularity are normal. No adenopathy. No bone lesions. IMPRESSION: Consolidation with volume loss in the apical segment of the left upper lobe. Lungs elsewhere clear. Heart size normal. No adenopathy evident. Followup PA and lateral chest radiographs recommended in 3-4 weeks following trial of antibiotic therapy to ensure resolution and exclude underlying malignancy. Electronically Signed   By: Lowella Grip III M.D.   On: 10/15/2018 14:36    Pending Labs Unresulted Labs (From admission, onward)    Start     Ordered   10/18/18 0500  Creatinine, serum  Every Mon-Wed-Fri (0500),   R     10/15/18 2026   10/16/18 0500  Procalcitonin  Daily,   R     10/15/18 1743   10/15/18 2013  HIV antibody (Routine Testing)  Once,   R     10/15/18 2012   10/15/18 1833  Respiratory Panel by PCR  (Respiratory virus panel with precautions)  Add-on,   R     10/15/18 1832   10/15/18 1350  Novel Coronavirus, NAA (hospital order; send-out to ref lab)  (Novel Coronavirus, NAA [facility] Order; send-out to ref lab) with precautions panel)  Once,   R    Question Answer Comment  Current symptoms Fever and Shortness of breath   Excluded other viral illnesses No (testing not indicated)   Patient immune status Normal      10/15/18 1351          Vitals/Pain Today's Vitals   10/15/18 2227 10/15/18 2300 10/15/18 2330 10/16/18 0000  BP:  (!) 110/59 (!) 105/58 97/66  Pulse:  65 71 67  Resp:    20  Temp:      TempSrc:  SpO2:  99% 97% 97%  Weight:      Height:      PainSc: 3        Isolation Precautions Airborne and Contact precautions  Medications Medications  albuterol (PROVENTIL  HFA;VENTOLIN HFA) 108 (90 Base) MCG/ACT inhaler 2 puff (has no administration in time range)  enoxaparin (LOVENOX) injection 40 mg (40 mg Subcutaneous Given 10/15/18 2110)  acetaminophen (TYLENOL) tablet 650 mg (650 mg Oral Given 10/15/18 2142)    Or  acetaminophen (TYLENOL) suppository 650 mg ( Rectal See Alternative 10/15/18 2142)  polyethylene glycol (MIRALAX / GLYCOLAX) packet 17 g (has no administration in time range)  ondansetron (ZOFRAN) tablet 4 mg (has no administration in time range)    Or  ondansetron (ZOFRAN) injection 4 mg (has no administration in time range)  ceFEPIme (MAXIPIME) 2 g in sodium chloride 0.9 % 100 mL IVPB (has no administration in time range)  vancomycin (VANCOCIN) IVPB 750 mg/150 ml premix (has no administration in time range)  sodium chloride 0.9 % bolus 1,000 mL (0 mLs Intravenous Stopped 10/15/18 2010)  vancomycin (VANCOCIN) 1,500 mg in sodium chloride 0.9 % 500 mL IVPB (0 mg Intravenous Stopped 10/15/18 2119)  ceFEPIme (MAXIPIME) 2 g in sodium chloride 0.9 % 100 mL IVPB (0 g Intravenous Stopped 10/15/18 1934)    Mobility walks Low fall risk   Focused Assessments Pulmonary Assessment Handoff:  Lung sounds: Bilateral Breath Sounds: Diminished O2 Device: Room Air        R Recommendations: See Admitting Provider Note  Report given to: Donnetta Simpers RN with Garen Grams RN at [facility]  Additional Notes:

## 2018-10-16 NOTE — Progress Notes (Signed)
PROGRESS NOTE    Yolanda Miller  WUX:[id number] DOB: 11-Jun-1956 DOA: 10/15/2018 PCP: [facility]    Brief Narrative:  63 year old woman with past medical history relevant for hypothyroidism, tobacco abuse the emergency department with shortness of breath, cough, fevers as well as body aches and was found to have likely community-acquired pneumonia with exposure to patient with Covid 19.   Assessment & Plan:   Active Problems:   Pneumonia   #) Sepsis due to community-acquired pneumonia: Patient presented with fever and elevated white blood cell count.  Chest x-ray shows evidence of pneumonia.  Most likely causes community-acquired pneumonia but patient did have exposure to novel coronavirus. -Continue IV cefepime and vancomycin started 10/16/2018 - Follow-up blood cultures ordered 10/15/2018 - Follow-up respiratory viral panel -Flu PCR negative -[place] coronavirus pending -Airborne precautions  #) Tobacco abuse/COPD: Patient likely has undiagnosed COPD.  Her wheezing is quite minimal and work of breathing is fairly small.  Unfortunately with the risk of having the possible novel coronavirus steroids are recommended against unless absolutely indicated. -Nicotine patch -Counseling provided -We will hold on steroids - We will order albuterol MDI every 6 hours  #) Hypothyroidism: -Continue levothyroxine 100 mcg  Fluids: Gentle IV fluids Electrolytes: Monitor and supplement Nutrition: Regular diet  Prophylaxis: Enoxaparin  Disposition: Pending defervescence for 24 hours and ruling out coronavirus  Full code    Consultants:   None  Procedures:  None  Antimicrobials:  IV cefepime and vancomycin started 10/15/2018   Subjective: This morning the patient reports that she is quite anxious about being in the cold would rule out room.  She denies any nausea, vomiting, diarrhea.  She continues to report a cough that is minimally productive.  She  has not had any shortness of breath but she has not gotten up to walk yet.  Objective: Vitals:   10/16/18 0300 10/16/18 0500 10/16/18 0600 10/16/18 0800  BP: 131/71 (!) 87/58 126/70 (!) 143/91  Pulse:      Resp:  16 15 (!) 21  Temp:   98 F (36.7 C) 98.5 F (36.9 C)  TempSrc:   Oral Oral  SpO2: 96% 93% 97% 98%  Weight:      Height:        Intake/Output Summary (Last 24 hours) at 10/16/2018 0926 Last data filed at 10/16/2018 0831 Gross per 24 hour  Intake 1837.57 ml  Output -  Net 1837.57 ml   Filed Weights   10/15/18 1246 10/16/18 0145  Weight: 74.4 kg 68 kg    Examination:  General exam: Appears calm and comfortable  Respiratory system: No increased work of breathing, scattered expiratory wheezes, scattered rhonchi, Cardiovascular system: Regular rate and rhythm, no murmurs Gastrointestinal system: Soft, nondistended, no rebound or guarding, plus bowel sounds. Central nervous system: Alert and oriented.  Grossly intact, moving all extremities Extremities: No lower extremity edema Skin: No rashes over visible skin Psychiatry: Judgement and insight appear normal. Mood & affect appropriate.     Data Reviewed: I have personally reviewed following labs and imaging studies  CBC: Recent Labs  Lab 10/15/18 1821  WBC 12.4*  NEUTROABS 6.7  HGB 12.1  HCT 37.7  MCV 95.9  PLT 253   Basic Metabolic Panel: Recent Labs  Lab 10/15/18 1821  NA 136  K 3.6  CL 101  CO2 24  GLUCOSE 106*  BUN 13  CREATININE 0.66  CALCIUM 9.4   GFR: Estimated Creatinine Clearance: 69.9 mL/min (by C-G formula based on SCr  of 0.66 mg/dL). Liver Function Tests: Recent Labs  Lab 10/15/18 1821  AST 14*  ALT 15  ALKPHOS 70  BILITOT 0.4  PROT 8.3*  ALBUMIN 4.5   No results for input(s): LIPASE, AMYLASE in the last 168 hours. No results for input(s): AMMONIA in the last 168 hours. Coagulation Profile: No results for input(s): INR, PROTIME in the last 168 hours. Cardiac Enzymes:  No results for input(s): CKTOTAL, CKMB, CKMBINDEX, TROPONINI in the last 168 hours. BNP (last 3 results) No results for input(s): PROBNP in the last 8760 hours. HbA1C: No results for input(s): HGBA1C in the last 72 hours. CBG: No results for input(s): GLUCAP in the last 168 hours. Lipid Profile: No results for input(s): CHOL, HDL, LDLCALC, TRIG, CHOLHDL, LDLDIRECT in the last 72 hours. Thyroid Function Tests: No results for input(s): TSH, T4TOTAL, FREET4, T3FREE, THYROIDAB in the last 72 hours. Anemia Panel: No results for input(s): VITAMINB12, FOLATE, FERRITIN, TIBC, IRON, RETICCTPCT in the last 72 hours. Sepsis Labs: Recent Labs  Lab 10/15/18 1821  PROCALCITON <0.10    Recent Results (from the past 240 hour(s))  Blood culture (routine x 2)     Status: None (Preliminary result)   Collection Time: 10/15/18  6:22 PM  Result Value Ref Range Status   Specimen Description LEFT ANTECUBITAL  Final   Special Requests   Final    BOTTLES DRAWN AEROBIC AND ANAEROBIC Blood Culture adequate volume   Culture   Final    NO GROWTH < 12 HOURS Performed at [facility], [address] [place], [place] [address]    Report Status PENDING  Incomplete  Blood culture (routine x 2)     Status: None (Preliminary result)   Collection Time: 10/15/18  6:22 PM  Result Value Ref Range Status   Specimen Description RIGHT ANTECUBITAL  Final   Special Requests   Final    BOTTLES DRAWN AEROBIC AND ANAEROBIC Blood Culture adequate volume   Culture   Final    NO GROWTH < 12 HOURS Performed at [facility], [address], [place], [place] [address]    Report Status PENDING  Incomplete  MRSA PCR Screening     Status: None   Collection Time: 10/16/18  1:43 AM  Result Value Ref Range Status   MRSA by PCR NEGATIVE NEGATIVE Final    Comment:        The GeneXpert MRSA Assay (FDA approved for NASAL specimens only), is one component of a comprehensive MRSA colonization surveillance program. It is not  intended to diagnose MRSA infection nor to guide or monitor treatment for MRSA infections. Performed at [facility], [place] [address] [place], [place] [address]          Radiology Studies: Dg Chest Portable 1 View  Result Date: 10/15/2018 CLINICAL DATA:  Shortness of breath and cough EXAM: PORTABLE CHEST 1 VIEW COMPARISON:  September 12, 2018 FINDINGS: There is consolidation in the apical segment of the left upper lobe with volume loss. Lungs elsewhere are clear. Heart size and pulmonary vascularity are normal. No adenopathy. No bone lesions. IMPRESSION: Consolidation with volume loss in the apical segment of the left upper lobe. Lungs elsewhere clear. Heart size normal. No adenopathy evident. Followup PA and lateral chest radiographs recommended in 3-4 weeks following trial of antibiotic therapy to ensure resolution and exclude underlying malignancy. Electronically Signed   By: Lowella Grip III M.D.   On: 10/15/2018 14:36        Scheduled Meds: .  Borage   Oral Daily  . cholecalciferol  1,000 Units Oral Daily  . enoxaparin (LOVENOX) injection  40 mg Subcutaneous Q24H  . [START ON 10/17/2018] levothyroxine  100 mcg Oral QAC breakfast   Continuous Infusions: . ceFEPime (MAXIPIME) IV Stopped (10/16/18 0630)  . vancomycin Stopped (10/16/18 0714)     LOS: 1 day    Time spent: [place], MD [facility]  If 7PM-7AM, please contact night-coverage [email or web address] Password [facility] 10/16/2018, 9:26 AM

## 2018-10-17 DIAGNOSIS — Z720 Tobacco use: Secondary | ICD-10-CM

## 2018-10-17 DIAGNOSIS — E039 Hypothyroidism, unspecified: Secondary | ICD-10-CM

## 2018-10-17 DIAGNOSIS — Z20828 Contact with and (suspected) exposure to other viral communicable diseases: Secondary | ICD-10-CM | POA: Diagnosis present

## 2018-10-17 DIAGNOSIS — Z20822 Contact with and (suspected) exposure to covid-19: Secondary | ICD-10-CM | POA: Diagnosis present

## 2018-10-17 LAB — HIV ANTIBODY (ROUTINE TESTING W REFLEX): HIV Screen 4th Generation wRfx: NONREACTIVE

## 2018-10-17 MED ORDER — CHLORHEXIDINE GLUCONATE CLOTH 2 % EX PADS
6.0000 | MEDICATED_PAD | Freq: Every day | CUTANEOUS | Status: DC
  Administered 2018-10-17: 6 via TOPICAL

## 2018-10-17 MED ORDER — CEFDINIR 300 MG PO CAPS: 300.0000 mg | ORAL_CAPSULE | Freq: Two times a day (BID) | ORAL | 0 refills | Status: AC

## 2018-10-17 MED ORDER — AZITHROMYCIN 500 MG PO TABS: 500.0000 mg | ORAL_TABLET | Freq: Every day | ORAL | 0 refills | Status: AC

## 2018-10-17 NOTE — Discharge Instructions (Signed)
Community-Acquired Pneumonia, Adult °Pneumonia is an infection of the lungs. It causes swelling in the airways of the lungs. Mucus and fluid may also build up inside the airways. °One type of pneumonia can happen while a person is in a hospital. A different type can happen when a person is not in a hospital (community-acquired pneumonia).  °What are the causes? ° °This condition is caused by germs (viruses, bacteria, or fungi). Some types of germs can be passed from one person to another. This can happen when you breathe in droplets from the cough or sneeze of an infected person. °What increases the risk? °You are more likely to develop this condition if you: °· Have a long-term (chronic) disease, such as: °? Chronic obstructive pulmonary disease (COPD). °? Asthma. °? Cystic fibrosis. °? Congestive heart failure. °? Diabetes. °? Kidney disease. °· Have HIV. °· Have sickle cell disease. °· Have had your spleen removed. °· Do not take good care of your teeth and mouth (poor dental hygiene). °· Have a medical condition that increases the risk of breathing in droplets from your own mouth and nose. °· Have a weakened body defense system (immune system). °· Are a smoker. °· Travel to areas where the germs that cause this illness are common. °· Are around certain animals or the places they live. °What are the signs or symptoms? °· A dry cough. °· A wet (productive) cough. °· Fever. °· Sweating. °· Chest pain. This often happens when breathing deeply or coughing. °· Fast breathing or trouble breathing. °· Shortness of breath. °· Shaking chills. °· Feeling tired (fatigue). °· Muscle aches. °How is this treated? °Treatment for this condition depends on many things. Most adults can be treated at home. In some cases, treatment must happen in a hospital. Treatment may include: °· Medicines given by mouth or through an IV tube. °· Being given extra oxygen. °· Respiratory therapy. °In rare cases, treatment for very bad pneumonia  may include: °· Using a machine to help you breathe. °· Having a procedure to remove fluid from around your lungs. °Follow these instructions at home: °Medicines °· Take over-the-counter and prescription medicines only as told by your doctor. °? Only take cough medicine if you are losing sleep. °· If you were prescribed an antibiotic medicine, take it as told by your doctor. Do not stop taking the antibiotic even if you start to feel better. °General instructions ° °· Sleep with your head and neck raised (elevated). You can do this by sleeping in a recliner or by putting a few pillows under your head. °· Rest as needed. Get at least 8 hours of sleep each night. °· Drink enough water to keep your pee (urine) pale yellow. °· Eat a healthy diet that includes plenty of vegetables, fruits, whole grains, low-fat dairy products, and lean protein. °· Do not use any products that contain nicotine or tobacco. These include cigarettes, e-cigarettes, and chewing tobacco. If you need help quitting, ask your doctor. °· Keep all follow-up visits as told by your doctor. This is important. °How is this prevented? °A shot (vaccine) can help prevent pneumonia. Shots are often suggested for: °· People older than 62 years of age. °· People older than 62 years of age who: °? Are having cancer treatment. °? Have long-term (chronic) lung disease. °? Have problems with their body's defense system. °You may also prevent pneumonia if you take these actions: °· Get the flu (influenza) shot every year. °· Go to the dentist as   often as told.  Wash your hands often. If you cannot use soap and water, use hand sanitizer. Contact a doctor if:  You have a fever.  You lose sleep because your cough medicine does not help. Get help right away if:  You are short of breath and it gets worse.  You have more chest pain.  Your sickness gets worse. This is very serious if: ? You are an older adult. ? Your body's defense system is weak.  You  cough up blood. Summary  Pneumonia is an infection of the lungs.  Most adults can be treated at home. Some will need treatment in a hospital.  Drink enough water to keep your pee pale yellow.  Get at least 8 hours of sleep each night. This information is not intended to replace advice given to you by your health care provider. Make sure you discuss any questions you have with your health care provider. Document Released: 12/31/2007 Document Revised: 03/11/2018 Document Reviewed: 03/11/2018 Elsevier Interactive Patient Education  2019 [employer]. Continue antibiotics on discharge.  You are legally bound to self quarantine until your COVID-19 test comes back negative.  Use your inhaler as needed for wheezing and coughing.

## 2018-10-17 NOTE — Progress Notes (Signed)
Nutrition Brief Note  Patient identified on the Malnutrition Screening Tool (MST) Report  No weight loss per weight records. Pt eating 50-100% of meals at this time.  Wt Readings from Last 15 Encounters:  10/16/18 68 kg  09/12/18 65.8 kg  11/12/17 63.5 kg  12/21/11 68 kg  12/10/06 68 kg    Body mass index is 25.73 kg/m. Patient meets criteria for normal based on current BMI.   Current diet order is heart healthy, patient is consuming approximately 50-100% of meals at this time. Labs and medications reviewed.   No nutrition interventions warranted at this time. If nutrition issues arise, please consult RD.   Yolanda Bibles, MS, RD, [place] Dietitian Pager: [phone] After Hours Pager: [phone]

## 2018-10-17 NOTE — Discharge Summary (Signed)
Physician Discharge Summary  Yolanda Miller LOV:[id number] DOB: Jun 15, 1956 DOA: 10/15/2018  PCP: [facility] date: 10/15/2018 Discharge date: 10/17/2018  Admitted From: Home Disposition: Home  Recommendations for Outpatient Follow-up:  1. Follow up with PCP 3 days for results of COVID 19  2. Please follow up on the following pending results:COVID 19 test 3. Continue discussions regarding tobacco cessation  Home Health: No Equipment/Devices: None  Discharge Condition: stable CODE STATUS: Full code Diet recommendation: Regular diet  History of present illness:64  63 year old woman with past medical history relevant for hypothyroidism, tobacco abuse the emergency department with shortness of breath, cough, fevers as well as body aches and was found to have likely community-acquired pneumonia with exposure to patient with Covid 19.  Hospital course:  Sepsis due to community-acquired pneumonia:  Patient presented with fever and elevated white blood cell count.  Chest x-ray shows evidence of pneumonia.  Most likely causes community-acquired pneumonia but patient did have exposure to novel coronavirus.  Patient was started on broad-spectrum biotics with vancomycin and cefepime.  Influenza and MRSA PCR was negative.  Procalcitonin less than 0.10.  Patient was afebrile without leukocytosis.  We will de-escalate antibiotics to azithromycin and cefdinir.  Patient instructed to maintain self isolation until results of COVID 19 test known and to follow-up with her PCP next week.  Tobacco abuse/COPD: Patient likely has undiagnosed COPD.  Her wheezing is quite minimal and work of breathing is fairly small.  Unfortunately with the risk of having the possible novel coronavirus steroids are not recommended unless absolutely indicated.  She was supported with a nicotine patch.  Will need further discussion with her PCP regarding tobacco cessation.  Also recommend outpatient  PFTs once recovers from her pneumonia as above for classification of her likely underlying lung disease.  Hypothyroidism:Continue levothyroxine 100 mcg  Discharge Diagnoses:  Active Problems:   Pneumonia   Close Exposure to Covid-19 Virus    Discharge Instructions  Discharge Instructions    Call MD for:  difficulty breathing, headache or visual disturbances   Complete by:  As directed    Call MD for:  persistant dizziness or light-headedness   Complete by:  As directed    Call MD for:  persistant nausea and vomiting   Complete by:  As directed    Call MD for:  severe uncontrolled pain   Complete by:  As directed    Call MD for:  temperature >100.4   Complete by:  As directed    Diet - low sodium heart healthy   Complete by:  As directed    Discharge instructions   Complete by:  As directed    Continue antibiotics on discharge with azithromycin and cefdinir as directed for community-acquired pneumonia. CO VID-19 test for novel coronavirus pending at time of discharge.  Continue self-isolation until test results.  Please call PCP within 3 days for follow-up of lab result.  If cough, please use mask.   Increase activity slowly   Complete by:  As directed      Allergies as of 10/17/2018      Reactions   Oxycodone-acetaminophen Itching, Rash   Demerol [meperidine] Itching   Propoxyphene N-acetaminophen       Medication List    STOP taking these medications   ibuprofen 200 MG tablet Commonly known as:  ADVIL,MOTRIN   levofloxacin 500 MG tablet Commonly known as:  LEVAQUIN   predniSONE 10 MG (21) Tbpk tablet Commonly known as:  STERAPRED  UNI-PAK 21 TAB     TAKE these medications   azithromycin 500 MG tablet Commonly known as:  Zithromax Take 1 tablet (500 mg total) by mouth daily for 5 days.   cefdinir 300 MG capsule Commonly known as:  OMNICEF Take 1 capsule (300 mg total) by mouth 2 (two) times daily for 10 days.   cholecalciferol 1000 units tablet Commonly  known as:  VITAMIN D Take 1,000 Units by mouth daily.   levothyroxine 100 MCG tablet Commonly known as:  SYNTHROID, LEVOTHROID Take 100 mcg by mouth daily before breakfast.      Follow-up Information    [facility]. Call in 3 day(s).   Contact information: [address] [place] [address] [phone]          Allergies  Allergen Reactions  . Oxycodone-Acetaminophen Itching and Rash  . Demerol [Meperidine] Itching  . Propoxyphene N-Acetaminophen     Consultations:  None   Procedures/Studies: Dg Chest Portable 1 View  Result Date: 10/15/2018 CLINICAL DATA:  Shortness of breath and cough EXAM: PORTABLE CHEST 1 VIEW COMPARISON:  September 12, 2018 FINDINGS: There is consolidation in the apical segment of the left upper lobe with volume loss. Lungs elsewhere are clear. Heart size and pulmonary vascularity are normal. No adenopathy. No bone lesions. IMPRESSION: Consolidation with volume loss in the apical segment of the left upper lobe. Lungs elsewhere clear. Heart size normal. No adenopathy evident. Followup PA and lateral chest radiographs recommended in 3-4 weeks following trial of antibiotic therapy to ensure resolution and exclude underlying malignancy. Electronically Signed   By: Lowella Grip III M.D.   On: 10/15/2018 14:36   (Echo, Carotid, EGD, Colonoscopy, ERCP)    Subjective:   Discharge Exam: Vitals:   10/17/18 0800 10/17/18 0900  BP: 139/79 (!) 152/90  Pulse:    Resp: 19 16  Temp: 98.8 F (37.1 C)   SpO2: 97% 98%   Vitals:   10/17/18 0000 10/17/18 0600 10/17/18 0800 10/17/18 0900  BP:  139/73 139/79 (!) 152/90  Pulse:      Resp: 17 (!) 22 19 16   Temp:  98.6 F (37 C) 98.8 F (37.1 C)   TempSrc:  Oral Oral   SpO2: 95% 96% 97% 98%  Weight:      Height:        General: Pt is alert, awake, not in acute distress Cardiovascular: RRR, S1/S2 +, no rubs, no gallops Respiratory: CTA bilaterally, no wheezing, no  rhonchi Abdominal: Soft, NT, ND, bowel sounds + Extremities: no edema, no cyanosis    The results of significant diagnostics from this hospitalization (including imaging, microbiology, ancillary and laboratory) are listed below for reference.     Microbiology: Recent Results (from the past 240 hour(s))  Blood culture (routine x 2)     Status: None (Preliminary result)   Collection Time: 10/15/18  6:22 PM  Result Value Ref Range Status   Specimen Description LEFT ANTECUBITAL  Final   Special Requests   Final    BOTTLES DRAWN AEROBIC AND ANAEROBIC Blood Culture adequate volume   Culture   Final    NO GROWTH 2 DAYS Performed at [facility], [address], [place], [place] [address]    Report Status PENDING  Incomplete  Blood culture (routine x 2)     Status: None (Preliminary result)   Collection Time: 10/15/18  6:22 PM  Result Value Ref Range Status   Specimen Description RIGHT ANTECUBITAL  Final  Special Requests   Final    BOTTLES DRAWN AEROBIC AND ANAEROBIC Blood Culture adequate volume   Culture   Final    NO GROWTH 2 DAYS Performed at [facility], [address], [place], [place] [address]    Report Status PENDING  Incomplete  MRSA PCR Screening     Status: None   Collection Time: 10/16/18  1:43 AM  Result Value Ref Range Status   MRSA by PCR NEGATIVE NEGATIVE Final    Comment:        The GeneXpert MRSA Assay (FDA approved for NASAL specimens only), is one component of a comprehensive MRSA colonization surveillance program. It is not intended to diagnose MRSA infection nor to guide or monitor treatment for MRSA infections. Performed at [facility], [place] [address], [place], [place] [address]      Labs: BNP (last 3 results) No results for input(s): BNP in the last 8760 hours. Basic Metabolic Panel: Recent Labs  Lab 10/15/18 1821  NA 136  K 3.6  CL 101  CO2 24  GLUCOSE 106*  BUN 13  CREATININE 0.66  CALCIUM 9.4   Liver Function  Tests: Recent Labs  Lab 10/15/18 1821  AST 14*  ALT 15  ALKPHOS 70  BILITOT 0.4  PROT 8.3*  ALBUMIN 4.5   No results for input(s): LIPASE, AMYLASE in the last 168 hours. No results for input(s): AMMONIA in the last 168 hours. CBC: Recent Labs  Lab 10/15/18 1821  WBC 12.4*  NEUTROABS 6.7  HGB 12.1  HCT 37.7  MCV 95.9  PLT 378   Cardiac Enzymes: No results for input(s): CKTOTAL, CKMB, CKMBINDEX, TROPONINI in the last 168 hours. BNP: Invalid input(s): POCBNP CBG: No results for input(s): GLUCAP in the last 168 hours. D-Dimer No results for input(s): DDIMER in the last 72 hours. Hgb A1c No results for input(s): HGBA1C in the last 72 hours. Lipid Profile No results for input(s): CHOL, HDL, LDLCALC, TRIG, CHOLHDL, LDLDIRECT in the last 72 hours. Thyroid function studies No results for input(s): TSH, T4TOTAL, T3FREE, THYROIDAB in the last 72 hours.  Invalid input(s): FREET3 Anemia work up No results for input(s): VITAMINB12, FOLATE, FERRITIN, TIBC, IRON, RETICCTPCT in the last 72 hours. Urinalysis    Component Value Date/Time   COLORURINE YELLOW 09/12/2018 2104   APPEARANCEUR CLEAR 09/12/2018 2104   LABSPEC 1.023 09/12/2018 2104   PHURINE 5.0 09/12/2018 2104   GLUCOSEU NEGATIVE 09/12/2018 2104   HGBUR NEGATIVE 09/12/2018 2104   BILIRUBINUR NEGATIVE 09/12/2018 2104   KETONESUR NEGATIVE 09/12/2018 2104   PROTEINUR NEGATIVE 09/12/2018 2104   UROBILINOGEN 4.0 (H) 12/21/2011 2335   NITRITE NEGATIVE 09/12/2018 2104   LEUKOCYTESUR TRACE (A) 09/12/2018 2104   Sepsis Labs Invalid input(s): PROCALCITONIN,  WBC,  LACTICIDVEN Microbiology Recent Results (from the past 240 hour(s))  Blood culture (routine x 2)     Status: None (Preliminary result)   Collection Time: 10/15/18  6:22 PM  Result Value Ref Range Status   Specimen Description LEFT ANTECUBITAL  Final   Special Requests   Final    BOTTLES DRAWN AEROBIC AND ANAEROBIC Blood Culture adequate volume   Culture    Final    NO GROWTH 2 DAYS Performed at [facility], [address], [place], [place] [address]    Report Status PENDING  Incomplete  Blood culture (routine x 2)     Status: None (Preliminary result)   Collection Time: 10/15/18  6:22 PM  Result Value Ref Range Status  Specimen Description RIGHT ANTECUBITAL  Final   Special Requests   Final    BOTTLES DRAWN AEROBIC AND ANAEROBIC Blood Culture adequate volume   Culture   Final    NO GROWTH 2 DAYS Performed at [facility], [address] [place], [place] [address]    Report Status PENDING  Incomplete  MRSA PCR Screening     Status: None   Collection Time: 10/16/18  1:43 AM  Result Value Ref Range Status   MRSA by PCR NEGATIVE NEGATIVE Final    Comment:        The GeneXpert MRSA Assay (FDA approved for NASAL specimens only), is one component of a comprehensive MRSA colonization surveillance program. It is not intended to diagnose MRSA infection nor to guide or monitor treatment for MRSA infections. Performed at [facility], [place] [address], [place], [place] [address]      Time coordinating discharge: Over 30 minutes  SIGNED:   Donnamarie Poag [place] DO  [facility] 10/17/2018, 11:53 AM

## 2018-10-20 LAB — CULTURE, BLOOD (ROUTINE X 2)
Culture: NO GROWTH
Culture: NO GROWTH
SPECIAL REQUESTS: ADEQUATE
Special Requests: ADEQUATE

## 2018-10-27 ENCOUNTER — Telehealth (INDEPENDENT_AMBULATORY_CARE_PROVIDER_SITE_OTHER): Payer: Self-pay | Admitting: Orthopedic Surgery

## 2018-10-27 ENCOUNTER — Encounter (INDEPENDENT_AMBULATORY_CARE_PROVIDER_SITE_OTHER): Payer: Self-pay | Admitting: Orthopaedic Surgery

## 2018-10-27 LAB — NOVEL CORONAVIRUS, NAA (HOSP ORDER, SEND-OUT TO REF LAB; TAT 18-24 HRS): SARS-CoV-2, NAA: NOT DETECTED

## 2018-10-27 NOTE — Telephone Encounter (Signed)
Ms. Stuckert sent a my chart message to Dr. Lorin Mercy requesting to know the results of her COVID-19 test that she had at [facility] on March 20th.  I advised her that we were her orthopedic office and did not order the test so I could not give her results, but I called the [facility] and they advised me that she could call [phone] to inquire about her results.

## 2018-11-01 NOTE — Progress Notes (Signed)
Patient was seen by admitting provider not to have sepsis, although on presentation she had an elevated fever, elevated white blood cell count and evidence of pneumonia on chest x-ray likely secondary to community-acquired pneumonia which meets criteria for sepsis.  This patient did have sepsis on presentation to the hospital.

## 2018-11-02 NOTE — Progress Notes (Signed)
COVID-19 test results have returned as not detected.  Subsequently COVID-19 ruled out.

## 2018-11-03 ENCOUNTER — Other Ambulatory Visit: Payer: Self-pay | Admitting: Nurse Practitioner

## 2018-11-03 DIAGNOSIS — R0602 Shortness of breath: Secondary | ICD-10-CM

## 2018-11-04 ENCOUNTER — Ambulatory Visit (HOSPITAL_COMMUNITY)
Admission: RE | Admit: 2018-11-04 | Discharge: 2018-11-04 | Disposition: A | Discharge status: Home / Self Care | Payer: BC Managed Care – PPO | Source: Ambulatory Visit | Attending: Nurse Practitioner | Admitting: Nurse Practitioner

## 2018-11-04 ENCOUNTER — Other Ambulatory Visit: Payer: Self-pay

## 2018-11-04 DIAGNOSIS — R0602 Shortness of breath: Secondary | ICD-10-CM | POA: Insufficient documentation

## 2018-11-05 ENCOUNTER — Encounter (HOSPITAL_COMMUNITY): Payer: Self-pay

## 2018-11-05 ENCOUNTER — Other Ambulatory Visit: Payer: Self-pay

## 2018-11-05 ENCOUNTER — Emergency Department (HOSPITAL_COMMUNITY): Payer: BC Managed Care – PPO

## 2018-11-05 ENCOUNTER — Emergency Department (HOSPITAL_COMMUNITY)
Admission: EM | Admit: 2018-11-05 | Discharge: 2018-11-05 | Disposition: A | Discharge status: Home / Self Care | Payer: BC Managed Care – PPO | Attending: Emergency Medicine | Admitting: Emergency Medicine

## 2018-11-05 DIAGNOSIS — M5410 Radiculopathy, site unspecified: Secondary | ICD-10-CM | POA: Insufficient documentation

## 2018-11-05 DIAGNOSIS — E039 Hypothyroidism, unspecified: Secondary | ICD-10-CM | POA: Insufficient documentation

## 2018-11-05 DIAGNOSIS — Z79899 Other long term (current) drug therapy: Secondary | ICD-10-CM | POA: Diagnosis not present

## 2018-11-05 DIAGNOSIS — I10 Essential (primary) hypertension: Secondary | ICD-10-CM | POA: Insufficient documentation

## 2018-11-05 DIAGNOSIS — F17200 Nicotine dependence, unspecified, uncomplicated: Secondary | ICD-10-CM | POA: Insufficient documentation

## 2018-11-05 DIAGNOSIS — M79604 Pain in right leg: Secondary | ICD-10-CM | POA: Diagnosis present

## 2018-11-05 HISTORY — DX: Pneumonia, unspecified organism: J18.9

## 2018-11-05 MED ORDER — KETOROLAC TROMETHAMINE 60 MG/2ML IM SOLN
60.0000 mg | Freq: Once | INTRAMUSCULAR | Status: AC
  Administered 2018-11-05: 60 mg via INTRAMUSCULAR
  Filled 2018-11-05: qty 2

## 2018-11-05 NOTE — ED Provider Notes (Signed)
[facility] EMERGENCY DEPARTMENT Provider Note   CSN: [id number] Arrival date & time: 11/05/18  1228    History   Chief Complaint Chief Complaint  Patient presents with   Leg Pain    HPI Yolanda Miller is a 63 y.o. female.     Persistent pain in lower back radiating to right buttocks and to right foot for the past 2 weeks after coughing vigorously.  She has been seen by her primary care doctor and put on tramadol, prednisone, meloxicam.  This is not helping much.  Severity is moderate.  Palpation and positioning make pain worse.  No bowel or bladder incontinence.     Past Medical History:  Diagnosis Date   Anxiety    Depression    Migraines    Pneumonia    Thyroid disease     Patient Active Problem List   Diagnosis Date Noted   Close Exposure to Covid-19 Virus 10/17/2018   Pneumonia 10/15/2018   HYPOTHYROIDISM 03/24/2007   HYPERTENSION 03/24/2007    Past Surgical History:  Procedure Laterality Date   ABDOMINAL HYSTERECTOMY     CESAREAN SECTION     LEG SURGERY Left 2004   THYROID CYST EXCISION     THYROIDECTOMY     parathyroid as well     OB History   No obstetric history on file.      Home Medications    Prior to Admission medications   Medication Sig Start Date End Date Taking? Authorizing Provider  albuterol (PROVENTIL HFA;VENTOLIN HFA) 108 (90 Base) MCG/ACT inhaler Inhale 1-2 puffs into the lungs every 6 (six) hours as needed for wheezing or shortness of breath.   Yes [provider]  cholecalciferol (VITAMIN D) 1000 units tablet Take 1,000 Units by mouth daily.   Yes [provider]  levothyroxine (SYNTHROID, LEVOTHROID) 100 MCG tablet Take 100 mcg by mouth daily before breakfast.    Yes [provider]  meloxicam (MOBIC) 7.5 MG tablet Take 1 tablet by mouth 2 (two) times daily as needed. 11/01/18  Yes [provider]  predniSONE (DELTASONE) 20 MG tablet Take 1 tablet by mouth 2 (two) times daily.  11/01/18  Yes [provider]  traMADol (ULTRAM) 50 MG tablet Take 1 tablet by mouth 3 (three) times daily as needed. 11/01/18   [provider]    Family History Family History  Problem Relation Age of Onset   Cancer Mother    Cancer Father    Cancer Brother     Social History Social History   Tobacco Use   Smoking status: Current Every Day Smoker    Packs/day: 0.50   Smokeless tobacco: Never Used  Substance Use Topics   Alcohol use: Not Currently    Comment: occasionally   Drug use: Not on file     Allergies   Oxycodone-acetaminophen; Demerol [meperidine]; and Propoxyphene n-acetaminophen   Review of Systems Review of Systems  All other systems reviewed and are negative.    Physical Exam Updated Vital Signs BP (!) 143/84    Pulse 83    Temp 98.7 F (37.1 C) (Oral)    Resp 18    Ht 5\' 4"  (1.626 m)    Wt 68 kg    SpO2 97%    BMI 25.73 kg/m   Physical Exam Vitals signs and nursing note reviewed.  Constitutional:      Appearance: She is well-developed.  HENT:     Head: Normocephalic and atraumatic.  Eyes:  Conjunctiva/sclera: Conjunctivae normal.  Neck:     Musculoskeletal: Neck supple.  Cardiovascular:     Rate and Rhythm: Normal rate and regular rhythm.  Pulmonary:     Effort: Pulmonary effort is normal.     Breath sounds: Normal breath sounds.  Abdominal:     General: Bowel sounds are normal.     Palpations: Abdomen is soft.  Musculoskeletal: Normal range of motion.     Comments: Minimal tenderness lower back and in the distribution of the right sciatic nerve.  Pain with straight leg raise on right.  Skin:    General: Skin is warm and dry.  Neurological:     Mental Status: She is alert and oriented to person, place, and time.  Psychiatric:        Behavior: Behavior normal.      ED Treatments / Results  Labs (all labs ordered are listed, but only abnormal results are displayed) Labs Reviewed - No data to  display  EKG None  Radiology Dg Chest 2 View  Result Date: 11/04/2018 CLINICAL DATA:  Cough with low-grade fever. Symptoms since January. History of pneumonia. Short of breath. EXAM: CHEST - 2 VIEW COMPARISON:  10/15/2018 and 09/12/2018. FINDINGS: Persistent left upper lobe opacity with associated volume loss. Suggestion of a central mass above the left mainstem bronchus deviating the lower trachea to the right. Remainder of the left lung is clear. Right lung is clear. Lungs are hyperexpanded. No pleural effusion or pneumothorax. Cardiac silhouette is normal in size. No other evidence of a mediastinal mass. No convincing hilar mass. Skeletal structures are demineralized but intact. IMPRESSION: 1. Left upper lobe opacity with associated volume loss. This is without significant change from the most recent prior exam. Questionable central obstructing mass. Follow-up nonemergent CT with contrast is recommended. 2. Lungs are hyperexpanded but otherwise clear. Underlying COPD suspected. Electronically Signed   By: Lajean Manes M.D.   On: 11/04/2018 14:14   Dg Lumbar Spine Complete  Result Date: 11/05/2018 CLINICAL DATA:  pain in her right leg from the back of her buttocks Down her leg For 2 weeks now. Pt reports pain began after she started coughing, no injury. Pt recently hospitalized for pneumonia, covid test neg. Initial encounter. EXAM: LUMBAR SPINE - COMPLETE 4+ VIEW COMPARISON:  None. FINDINGS: Mild dextroscoliosis of the lower lumbar spine, possibly accentuated by patient positioning. Alignment is otherwise normal with no evidence of acute vertebral body subluxation. Vertebral body heights are within normal limits throughout. No fracture line or displaced fracture fragment seen. Mild degenerative spurring within the lower lumbar spine. No large osteophytes or other signs of advanced degenerative change. No evidence of pars interarticularis defect. Atherosclerosis of the infrarenal abdominal aorta.  Visualized paravertebral soft tissues are otherwise unremarkable. IMPRESSION: 1. No acute findings. 2. Mild degenerative change within the lower lumbar spine. 3. Aortic atherosclerosis. Electronically Signed   By: Franki Cabot M.D.   On: 11/05/2018 14:15    Procedures Procedures (including critical care time)  Medications Ordered in ED Medications  ketorolac (TORADOL) injection 60 mg (60 mg Intramuscular Given 11/05/18 1414)     Initial Impression / Assessment and Plan / ED Course  I have reviewed the triage vital signs and the nursing notes.  Pertinent labs & imaging results that were available during my care of the patient were reviewed by me and considered in my medical decision making (see chart for details).        History and physical consistent with right-sided lower extremity  radicular pain.  Screening plain films of lumbar spine showed no acute abnormalities.  Patient will continue her medications, apply ice, pain patch, follow-up with her primary care doctor for a probable MRI in the future  Final Clinical Impressions(s) / ED Diagnoses   Final diagnoses:  Radicular pain of right lower extremity    ED Discharge Orders    None       Nat Christen, MD 11/05/18 [phone]

## 2018-11-05 NOTE — ED Triage Notes (Addendum)
Pt reports that she has pain in her right leg from the back of her buttocks  Down her leg  For 2 weeks now. Pt reports  pain began after she started coughing and then pain was there after she got out of bed. Worse with movement. Pt was given prednisone and meloxicam without relief. Pt was hospitalized for pneumonia and was negative for covid

## 2018-11-05 NOTE — Discharge Instructions (Addendum)
X-ray showed no life-threatening condition.  You may need an MRI at some point if this pain does not improve.  Continue your home medicines.  Ice pack.  Try the numbing patch

## 2018-11-19 ENCOUNTER — Other Ambulatory Visit: Payer: Self-pay | Admitting: Nurse Practitioner

## 2018-11-19 ENCOUNTER — Other Ambulatory Visit (HOSPITAL_COMMUNITY): Payer: Self-pay | Admitting: Nurse Practitioner

## 2018-11-19 DIAGNOSIS — R918 Other nonspecific abnormal finding of lung field: Secondary | ICD-10-CM

## 2018-11-23 ENCOUNTER — Encounter (HOSPITAL_COMMUNITY): Payer: Self-pay

## 2018-11-23 ENCOUNTER — Ambulatory Visit (HOSPITAL_COMMUNITY)
Admission: RE | Admit: 2018-11-23 | Discharge: 2018-11-23 | Disposition: A | Discharge status: Home / Self Care | Payer: BC Managed Care – PPO | Source: Ambulatory Visit | Attending: Nurse Practitioner | Admitting: Nurse Practitioner

## 2018-11-23 ENCOUNTER — Other Ambulatory Visit: Payer: Self-pay

## 2018-11-23 DIAGNOSIS — R918 Other nonspecific abnormal finding of lung field: Secondary | ICD-10-CM

## 2018-11-23 MED ORDER — IOHEXOL 300 MG/ML  SOLN: 75.0000 mL | Freq: Once | INTRAMUSCULAR | Status: DC | PRN

## 2018-11-24 ENCOUNTER — Ambulatory Visit (HOSPITAL_COMMUNITY)
Admission: RE | Admit: 2018-11-24 | Discharge: 2018-11-24 | Disposition: A | Discharge status: Home / Self Care | Payer: BC Managed Care – PPO | Source: Ambulatory Visit | Attending: Nurse Practitioner | Admitting: Nurse Practitioner

## 2018-11-24 ENCOUNTER — Other Ambulatory Visit: Payer: Self-pay

## 2018-11-24 DIAGNOSIS — R918 Other nonspecific abnormal finding of lung field: Secondary | ICD-10-CM | POA: Diagnosis not present

## 2018-11-24 MED ORDER — IOHEXOL 300 MG/ML  SOLN
75.0000 mL | Freq: Once | INTRAMUSCULAR | Status: AC | PRN
  Administered 2018-11-24: 15:00:00 75 mL via INTRAVENOUS

## 2018-11-25 ENCOUNTER — Inpatient Hospital Stay (HOSPITAL_COMMUNITY): Payer: BC Managed Care – PPO | Admitting: Hematology

## 2018-11-25 NOTE — Progress Notes (Signed)
[facility] NOTE  Patient Care Team: [facility] as PCP - General  CHIEF COMPLAINTS/PURPOSE OF CONSULTATION:  Newly diagnosed lung mass  HISTORY OF PRESENTING ILLNESS:  Yolanda Miller 63 y.o. female presents for consults regarding newly diagnosed left upper lobe lung mass. She presented to her PCP in February with reports of fever, cough, hoarseness, and shortness of breath. Novel coronavirus and influenza panel were negative. CXR was preformed and suggested pneumonia. Imaging with plain film was repeated in March, which suggested consolidation with volumes loss in the apical segment of LUL. Repeat imaging was recommended and repeated in April. CXR at that time revealed LUL opacity with volume loss. CT of chest was ordered and confirmed 5.4 x5.7 cm mediastinal mass involving the left hilum and AP window. There is a 7.8 x 4.5 cm mass in the medial LUL with LUL collapse.  There are smaller nodules noted through right and left lungs. An 11 mm left adrenal nodule is noted.   She presents today with reports of possible siatica pain of right leg x 4 weeks. She is taking hydrocodone with no relief. She continues with dry cough and hoarseness. She has lost 10 lbs over the last 3 mths. She has a 1ppd x 40 years, smoking history. She has a PMH significant for stura-ovary. She has a family history significant for unknown cancer in her father, liposarcoma in her mother, colon cancer in maternal aunt, pancreatic cancer maternal uncle, and maternal uncle with throat cancer.     MEDICAL HISTORY:  Past Medical History:  Diagnosis Date  . Anxiety   . Depression   . Migraines   . Pneumonia   . Thyroid disease     SURGICAL HISTORY: Past Surgical History:  Procedure Laterality Date  . ABDOMINAL HYSTERECTOMY    . CESAREAN SECTION    . LEG SURGERY Left 2004  . THYROID CYST EXCISION    . THYROIDECTOMY     parathyroid as well    SOCIAL HISTORY: Social  History   Socioeconomic History  . Marital status: Divorced    Spouse name: Not on file  . Number of children: Not on file  . Years of education: Not on file  . Highest education level: Not on file  Occupational History  . Not on file  Social Needs  . Financial resource strain: Not on file  . Food insecurity:    Worry: Not on file    Inability: Not on file  . Transportation needs:    Medical: Not on file    Non-medical: Not on file  Tobacco Use  . Smoking status: Current Every Day Smoker    Packs/day: 0.50  . Smokeless tobacco: Never Used  Substance and Sexual Activity  . Alcohol use: Not Currently    Comment: occasionally  . Drug use: Not on file  . Sexual activity: Not on file  Lifestyle  . Physical activity:    Days per week: Not on file    Minutes per session: Not on file  . Stress: Not on file  Relationships  . Social connections:    Talks on phone: Not on file    Gets together: Not on file    Attends religious service: Not on file    Active member of club or organization: Not on file    Attends meetings of clubs or organizations: Not on file    Relationship status: Not on file  . Intimate partner violence:  Fear of current or ex partner: Not on file    Emotionally abused: Not on file    Physically abused: Not on file    Forced sexual activity: Not on file  Other Topics Concern  . Not on file  Social History Narrative  . Not on file    FAMILY HISTORY: Family History  Problem Relation Age of Onset  . Cancer Mother   . Cancer Father   . Cancer Brother     ALLERGIES:  is allergic to oxycodone-acetaminophen; demerol [meperidine]; and propoxyphene n-acetaminophen.  MEDICATIONS:  Current Outpatient Medications  Medication Sig Dispense Refill  . albuterol (PROVENTIL HFA;VENTOLIN HFA) 108 (90 Base) MCG/ACT inhaler Inhale 1-2 puffs into the lungs every 6 (six) hours as needed for wheezing or shortness of breath.    . cholecalciferol (VITAMIN D) 1000  units tablet Take 1,000 Units by mouth daily.    Marland Kitchen levothyroxine (SYNTHROID, LEVOTHROID) 100 MCG tablet Take 100 mcg by mouth daily before breakfast.     . meloxicam (MOBIC) 7.5 MG tablet Take 1 tablet by mouth 2 (two) times daily as needed.    . predniSONE (DELTASONE) 20 MG tablet Take 1 tablet by mouth 2 (two) times daily.    . traMADol (ULTRAM) 50 MG tablet Take 1 tablet by mouth 3 (three) times daily as needed.     No current facility-administered medications for this visit.     REVIEW OF SYSTEMS:   Constitutional: Denies fevers, chills or abnormal night sweats Eyes: Denies blurriness of vision, double vision or watery eyes Ears, nose, mouth, throat, and face: Denies mucositis or sore throat Respiratory: Denies cough, dyspnea or wheezes Cardiovascular: Denies palpitation, chest discomfort or lower extremity swelling Gastrointestinal:  Denies nausea, heartburn or change in bowel habits Skin: Denies abnormal skin rashes Lymphatics: Denies new lymphadenopathy or easy bruising Neurological:Denies numbness, tingling or new weaknesses Behavioral/Psych: Mood is stable, no new changes  All other systems were reviewed with the patient and are negative.  PHYSICAL EXAMINATION: ECOG PERFORMANCE STATUS: 2 - Symptomatic, <50% confined to bed   GENERAL:alert, no distress and comfortable SKIN: skin color, texture, turgor are normal, no rashes or significant lesions EYES: normal, conjunctiva are pink and non-injected, sclera clear OROPHARYNX:no exudate, no erythema and lips, buccal mucosa, and tongue normal  NECK: supple, thyroid normal size, non-tender, without nodularity LYMPH:  no palpable lymphadenopathy in the cervical, axillary or inguinal LUNGS: clear to auscultation and percussion with normal breathing effort HEART: regular rate & rhythm and no murmurs and no lower extremity edema ABDOMEN:abdomen soft, non-tender and normal bowel sounds Musculoskeletal:no cyanosis of digits and no  clubbing  PSYCH: alert & oriented x 3 with fluent speech NEURO: no focal motor/sensory deficits  LABORATORY DATA:  I have reviewed the data as listed Lab Results  Component Value Date   WBC 12.4 (H) 10/15/2018   HGB 12.1 10/15/2018   HCT 37.7 10/15/2018   MCV 95.9 10/15/2018   PLT 378 10/15/2018     Chemistry      Component Value Date/Time   NA 136 10/15/2018 1821   K 3.6 10/15/2018 1821   CL 101 10/15/2018 1821   CO2 24 10/15/2018 1821   BUN 13 10/15/2018 1821   CREATININE 0.66 10/15/2018 1821      Component Value Date/Time   CALCIUM 9.4 10/15/2018 1821   CALCIUM 10.0 12/11/2006 0018   ALKPHOS 70 10/15/2018 1821   AST 14 (L) 10/15/2018 1821   ALT 15 10/15/2018 1821   BILITOT 0.4 10/15/2018  [place]: I have personally reviewed the radiological images as listed and agreed with the findings in the report. Dg Chest 2 View  Result Date: 11/04/2018 CLINICAL DATA:  Cough with low-grade fever. Symptoms since January. History of pneumonia. Short of breath. EXAM: CHEST - 2 VIEW COMPARISON:  10/15/2018 and 09/12/2018. FINDINGS: Persistent left upper lobe opacity with associated volume loss. Suggestion of a central mass above the left mainstem bronchus deviating the lower trachea to the right. Remainder of the left lung is clear. Right lung is clear. Lungs are hyperexpanded. No pleural effusion or pneumothorax. Cardiac silhouette is normal in size. No other evidence of a mediastinal mass. No convincing hilar mass. Skeletal structures are demineralized but intact. IMPRESSION: 1. Left upper lobe opacity with associated volume loss. This is without significant change from the most recent prior exam. Questionable central obstructing mass. Follow-up nonemergent CT with contrast is recommended. 2. Lungs are hyperexpanded but otherwise clear. Underlying COPD suspected. Electronically Signed   By: Lajean Manes M.D.   On: 11/04/2018 14:14   Dg Lumbar Spine  Complete  Result Date: 11/05/2018 CLINICAL DATA:  pain in her right leg from the back of her buttocks Down her leg For 2 weeks now. Pt reports pain began after she started coughing, no injury. Pt recently hospitalized for pneumonia, covid test neg. Initial encounter. EXAM: LUMBAR SPINE - COMPLETE 4+ VIEW COMPARISON:  None. FINDINGS: Mild dextroscoliosis of the lower lumbar spine, possibly accentuated by patient positioning. Alignment is otherwise normal with no evidence of acute vertebral body subluxation. Vertebral body heights are within normal limits throughout. No fracture line or displaced fracture fragment seen. Mild degenerative spurring within the lower lumbar spine. No large osteophytes or other signs of advanced degenerative change. No evidence of pars interarticularis defect. Atherosclerosis of the infrarenal abdominal aorta. Visualized paravertebral soft tissues are otherwise unremarkable. IMPRESSION: 1. No acute findings. 2. Mild degenerative change within the lower lumbar spine. 3. Aortic atherosclerosis. Electronically Signed   By: Franki Cabot M.D.   On: 11/05/2018 14:15   Ct Chest W Contrast  Result Date: 11/24/2018 CLINICAL DATA:  Left upper lobe lesion on chest x-ray. EXAM: CT CHEST WITH CONTRAST TECHNIQUE: Multidetector CT imaging of the chest was performed during intravenous contrast administration. CONTRAST:  58mL OMNIPAQUE IOHEXOL 300 MG/ML  SOLN COMPARISON:  Chest x-ray 11/04/2018 FINDINGS: Cardiovascular: The heart size is normal. Tiny pericardial effusion. Coronary artery calcification is evident. Atherosclerotic calcification is noted in the wall of the thoracic aorta. Mediastinum/Nodes: Large confluent mediastinal mass is identified involving the left hilum and AP window. At the level of the AP window, the lesion measures 5.4 x 5.7 cm. As it tracks inferiorly into the left hilum, the left main pulmonary artery is markedly attenuated by the disease, narrowing the lumen down to about  3 x 3 mm diameter. Left upper lobe pulmonary artery is obliterated. Subcarinal lymphadenopathy measures up to 13 mm short axis. No right hilar adenopathy. No axillary or supraclavicular lymphadenopathy. The esophagus has normal imaging features. Lungs/Pleura: 7.8 x 4.5 cm soft tissue mass is identified in the medial left upper lobe with associated left upper lobe collapse. 12 mm irregular nodular opacity is identified in the right middle lobe (83/4). No right pleural effusion. 4 mm irregular nodule identified inferior lingula (97/4). 7 mm irregular nodule identified in the left base (109/4). No left pleural effusion. Upper Abdomen: Haziness is identified in the fat around the upper abdominal aorta and in  the hepato duodenal ligament. 11 mm left adrenal nodule is well seen on coronal image 65 of series 5. Musculoskeletal: No worrisome lytic or sclerotic osseous abnormality. IMPRESSION: 1. Large medial left upper lobe pulmonary mass with associated left hilar, subcarinal and AP window bulky nodal/soft tissue disease. Left hilar adenopathy circumferentially encases and dramatically attenuates the left main pulmonary artery and obliterates apicoposterior and anterior bronchi to the left upper lobe. 2. Apical left upper lobe collapse. 3. Haziness with small lymph nodes identified in the upper abdomen with 11 mm left adrenal nodule associated. Dedicated abdomen and pelvis CT may be warranted to assess for metastatic disease. 4.  Aortic Atherosclerois (ICD10-170.0) These results will be called to the ordering clinician or representative by the Radiologist Assistant, and communication documented in the PACS or zVision Dashboard. Electronically Signed   By: Misty Stanley M.D.   On: 11/24/2018 17:50    ASSESSMENT & PLAN:  1. LUL Lung mass most likely malignant - Discussed CT findings with patient. Recommend staging work up. She will need a PET/CT, MRI brain, and bronchoscopy with biopsy. Once diagnostic work up is  complete, we will formulate a treatment plan.   2. Hx of struma Ovari: - We will complete additional lab work to rule out germ- line mutation.  3. R Sciatica pain; - Recommend MRI lumbar spine.  4. RTC once staging work up is complete.     All questions were answered. The patient knows to call the clinic with any problems, questions or concerns. I spent 60 minutes counseling the patient face to face. The total time spent in the appointment was 60 minutes and more than 50% was on counseling.     Derek Jack, MD 11/25/2018 2:10 PM

## 2018-11-26 ENCOUNTER — Other Ambulatory Visit: Payer: Self-pay

## 2018-11-26 ENCOUNTER — Other Ambulatory Visit (HOSPITAL_COMMUNITY): Payer: Self-pay | Admitting: *Deleted

## 2018-11-26 ENCOUNTER — Inpatient Hospital Stay (HOSPITAL_COMMUNITY): Payer: BC Managed Care – PPO | Attending: Hematology | Admitting: Hematology

## 2018-11-26 ENCOUNTER — Encounter (HOSPITAL_COMMUNITY): Payer: Self-pay | Admitting: Hematology

## 2018-11-26 VITALS — BP 133/77 | HR 103 | Temp 99.0°F | Resp 18 | Ht 64.0 in | Wt 140.8 lb

## 2018-11-26 DIAGNOSIS — Z90710 Acquired absence of both cervix and uterus: Secondary | ICD-10-CM | POA: Insufficient documentation

## 2018-11-26 DIAGNOSIS — C7951 Secondary malignant neoplasm of bone: Secondary | ICD-10-CM | POA: Insufficient documentation

## 2018-11-26 DIAGNOSIS — C3412 Malignant neoplasm of upper lobe, left bronchus or lung: Secondary | ICD-10-CM | POA: Diagnosis not present

## 2018-11-26 DIAGNOSIS — F17210 Nicotine dependence, cigarettes, uncomplicated: Secondary | ICD-10-CM | POA: Insufficient documentation

## 2018-11-26 DIAGNOSIS — M25551 Pain in right hip: Secondary | ICD-10-CM | POA: Diagnosis not present

## 2018-11-26 DIAGNOSIS — Z809 Family history of malignant neoplasm, unspecified: Secondary | ICD-10-CM | POA: Insufficient documentation

## 2018-11-26 DIAGNOSIS — K5900 Constipation, unspecified: Secondary | ICD-10-CM | POA: Diagnosis not present

## 2018-11-26 DIAGNOSIS — R918 Other nonspecific abnormal finding of lung field: Secondary | ICD-10-CM | POA: Diagnosis not present

## 2018-11-26 DIAGNOSIS — C801 Malignant (primary) neoplasm, unspecified: Secondary | ICD-10-CM | POA: Insufficient documentation

## 2018-11-26 DIAGNOSIS — C786 Secondary malignant neoplasm of retroperitoneum and peritoneum: Secondary | ICD-10-CM | POA: Insufficient documentation

## 2018-11-26 NOTE — Patient Instructions (Signed)
[place] at [facility] Discharge Instructions  You were seen today by Dr. Delton Coombes. He went over your recent scan results. He would like you to have a PET scan and an MRI of your brain. He would also like you to see a Psychologist, sport and exercise. He will see you back after your scans for follow up.   Thank you for choosing [place] at [facility] to provide your oncology and hematology care.  To afford each patient quality time with our provider, please arrive at least 15 minutes before your scheduled appointment time.   If you have a lab appointment with the [facility] please come in thru the  Main Entrance and check in at the main information desk  You need to re-schedule your appointment should you arrive 10 or more minutes late.  We strive to give you quality time with our providers, and arriving late affects you and other patients whose appointments are after yours.  Also, if you no show three or more times for appointments you may be dismissed from the clinic at the providers discretion.     Again, thank you for choosing [facility].  Our hope is that these requests will decrease the amount of time that you wait before being seen by our physicians.       _____________________________________________________________  Should you have questions after your visit to [facility], please contact our office at [phone] between the hours of 8:00 a.m. and 4:30 p.m.  Voicemails left after 4:00 p.m. will not be returned until the following business day.  For prescription refill requests, have your pharmacy contact our office and allow 72 hours.    Cancer Center Support Programs:   > Cancer Support Group  2nd Tuesday of the month 1pm-2pm, Journey Room

## 2018-11-29 ENCOUNTER — Inpatient Hospital Stay (HOSPITAL_COMMUNITY): Payer: BC Managed Care – PPO

## 2018-11-29 ENCOUNTER — Other Ambulatory Visit: Payer: Self-pay

## 2018-11-29 ENCOUNTER — Encounter (HOSPITAL_COMMUNITY): Payer: Self-pay | Admitting: *Deleted

## 2018-11-29 DIAGNOSIS — R918 Other nonspecific abnormal finding of lung field: Secondary | ICD-10-CM

## 2018-11-29 DIAGNOSIS — C3412 Malignant neoplasm of upper lobe, left bronchus or lung: Secondary | ICD-10-CM | POA: Diagnosis not present

## 2018-11-29 LAB — LACTATE DEHYDROGENASE: LDH: 123 U/L (ref 98–192)

## 2018-11-29 NOTE — Progress Notes (Signed)
Oncology Navigator Note:  Patient was referred to our office by her PCP.  I called patient today to introduce myself and provide information in how I will be involved with her care.  My phone number was given so that she can call me with any questions or concerns.  She voices appreciation and understanding. She states at this time she doesn't need anything but will call me should anything come up.

## 2018-11-30 LAB — AFP TUMOR MARKER: AFP, Serum, Tumor Marker: 2.4 ng/mL (ref 0.0–8.3)

## 2018-11-30 LAB — BETA HCG QUANT (REF LAB): hCG Quant: 1289 m[IU]/mL

## 2018-12-01 ENCOUNTER — Other Ambulatory Visit: Payer: Self-pay

## 2018-12-01 ENCOUNTER — Ambulatory Visit (HOSPITAL_COMMUNITY)
Admission: RE | Admit: 2018-12-01 | Discharge: 2018-12-01 | Disposition: A | Discharge status: Home / Self Care | Payer: BC Managed Care – PPO | Source: Ambulatory Visit | Attending: Hematology | Admitting: Hematology

## 2018-12-01 ENCOUNTER — Other Ambulatory Visit (HOSPITAL_COMMUNITY): Payer: Self-pay | Admitting: *Deleted

## 2018-12-01 DIAGNOSIS — R918 Other nonspecific abnormal finding of lung field: Secondary | ICD-10-CM | POA: Insufficient documentation

## 2018-12-01 DIAGNOSIS — C3412 Malignant neoplasm of upper lobe, left bronchus or lung: Secondary | ICD-10-CM | POA: Insufficient documentation

## 2018-12-01 LAB — POCT I-STAT CREATININE: Creatinine, Ser: 0.6 mg/dL (ref 0.44–1.00)

## 2018-12-01 MED ORDER — HYDROCODONE-ACETAMINOPHEN 5-325 MG PO TABS: 1.0000 | ORAL_TABLET | Freq: Four times a day (QID) | ORAL | 0 refills | Status: DC | PRN

## 2018-12-01 MED ORDER — GADOBUTROL 1 MMOL/ML IV SOLN
6.0000 mL | Freq: Once | INTRAVENOUS | Status: AC | PRN
  Administered 2018-12-01: 6 mL via INTRAVENOUS

## 2018-12-02 ENCOUNTER — Encounter: Payer: Self-pay | Admitting: General Practice

## 2018-12-02 NOTE — Progress Notes (Signed)
[place] Psychosocial Distress Screening Clinical Social Work  Clinical Social Work was referred by distress screening protocol.  The patient scored a 10 on the Psychosocial Distress Thermometer which indicates severe distress. Clinical Social Worker contacted patient by phone to assess for distress and other psychosocial needs. No answer, left VM w brief description of support services available, contact information and encouragement to call for support/resources as needed.    ONCBCN DISTRESS SCREENING 11/26/2018  Screening Type Initial Screening  Distress experienced in past week (1-10) 10  Emotional problem type Depression  Information Concerns Type Lack of info about diagnosis  Physician notified of physical symptoms Yes  Referral to clinical psychology No  Referral to clinical social work No  Referral to dietition No  Referral to financial advocate No  Referral to support programs No  Referral to palliative care No    Clinical Social Worker follow up needed: No.  Await patient return call.    If yes, follow up plan:  Beverely Pace, LCSW

## 2018-12-03 ENCOUNTER — Ambulatory Visit (HOSPITAL_COMMUNITY): Payer: BC Managed Care – PPO

## 2018-12-07 LAB — THYROGLOBULIN LEVEL: Thyroglobulin: 2.5 ng/mL

## 2018-12-08 ENCOUNTER — Ambulatory Visit (HOSPITAL_COMMUNITY)
Admission: RE | Admit: 2018-12-08 | Discharge: 2018-12-08 | Disposition: A | Discharge status: Home / Self Care | Payer: BC Managed Care – PPO | Source: Ambulatory Visit | Attending: Hematology | Admitting: Hematology

## 2018-12-08 ENCOUNTER — Other Ambulatory Visit: Payer: Self-pay

## 2018-12-08 DIAGNOSIS — R918 Other nonspecific abnormal finding of lung field: Secondary | ICD-10-CM | POA: Insufficient documentation

## 2018-12-08 DIAGNOSIS — C3412 Malignant neoplasm of upper lobe, left bronchus or lung: Secondary | ICD-10-CM | POA: Diagnosis present

## 2018-12-08 MED ORDER — FLUDEOXYGLUCOSE F - 18 (FDG) INJECTION
7.0000 | Freq: Once | INTRAVENOUS | Status: AC
  Administered 2018-12-08: 7 via INTRAVENOUS

## 2018-12-09 ENCOUNTER — Ambulatory Visit (HOSPITAL_COMMUNITY): Payer: BC Managed Care – PPO | Admitting: Hematology

## 2018-12-09 LAB — GLUCOSE, CAPILLARY: Glucose-Capillary: 104 mg/dL — ABNORMAL HIGH (ref 70–99)

## 2018-12-10 ENCOUNTER — Other Ambulatory Visit (HOSPITAL_COMMUNITY): Payer: Self-pay | Admitting: Diagnostic Radiology

## 2018-12-10 ENCOUNTER — Other Ambulatory Visit: Payer: Self-pay

## 2018-12-10 ENCOUNTER — Ambulatory Visit (HOSPITAL_COMMUNITY): Payer: BC Managed Care – PPO | Admitting: Hematology

## 2018-12-13 ENCOUNTER — Other Ambulatory Visit: Payer: Self-pay | Admitting: *Deleted

## 2018-12-13 ENCOUNTER — Encounter: Payer: Self-pay | Admitting: Thoracic Surgery (Cardiothoracic Vascular Surgery)

## 2018-12-13 ENCOUNTER — Other Ambulatory Visit: Payer: Self-pay

## 2018-12-13 ENCOUNTER — Institutional Professional Consult (permissible substitution): Payer: BC Managed Care – PPO | Admitting: Thoracic Surgery (Cardiothoracic Vascular Surgery)

## 2018-12-13 VITALS — BP 134/85 | HR 101 | Temp 98.8°F | Resp 16 | Ht 64.0 in | Wt 140.0 lb

## 2018-12-13 DIAGNOSIS — R918 Other nonspecific abnormal finding of lung field: Secondary | ICD-10-CM

## 2018-12-13 NOTE — H&P (View-Only) (Signed)
[facility] is [facility] Referring Provider is Derek Jack, MD  Chief Complaint  Patient presents with  . Lung Mass    LULobe...CT CHEST 4/29, PET 12/08/18    HPI: Yolanda Miller is referred for consideration for biopsy.  Yolanda Miller is a 63 year old woman with a history of tobacco abuse, anxiety, depression, migraines, and thyroidectomy.  She smokes much 2 packs/day starting at age 89 and quit just a few days ago (45-pack-year).  She was in her usual state of health until February when she presented with fever, cough, hoarseness, and shortness of breath.  Chest x-ray showed a possible pneumonia.  Test for coronavirus and influenza were negative.  She did not improve with treatment.  A repeat chest x-ray showed volume loss in the left upper lobe.  A CT of the chest showed a large left hilar mass and mediastinal mass.  A PET CT showed a hypermetabolic left upper lobe mass and AP window mass.  There also was a large expansile lytic lesion in the right iliac bone and a retroperitoneal metastasis.  She also complains of a poor appetite, 11 pound weight loss over the past 3 months, decreased energy, and right hip and buttock pain.  Zubrod Score: At the time of surgery this patient's most appropriate activity status/level should be described as: []     0    Normal activity, no symptoms []     1    Restricted in physical strenuous activity but ambulatory, able to do out light work [x]     2    Ambulatory and capable of self care, unable to do work activities, up and about >50 % of waking hours                              []     3    Only limited self care, in bed greater than 50% of waking hours []     4    Completely disabled, no self care, confined to bed or chair []     5    Moribund  Past Medical History:  Diagnosis Date  . Anxiety   . Depression   . Migraines   . Pneumonia   . Thyroid disease     Past Surgical History:  Procedure Laterality Date  . ABDOMINAL  HYSTERECTOMY    . CESAREAN SECTION    . LEG SURGERY Left 2004  . THYROID CYST EXCISION    . THYROIDECTOMY     parathyroid as well    Family History  Problem Relation Age of Onset  . Cancer Mother   . Cancer Father   . Cancer Brother     Social History Social History   Tobacco Use  . Smoking status: Current Every Day Smoker    Packs/day: 0.50  . Smokeless tobacco: Never Used  Substance Use Topics  . Alcohol use: Not Currently    Comment: occasionally  . Drug use: Not on file    Current Outpatient Medications  Medication Sig Dispense Refill  . albuterol (PROVENTIL HFA;VENTOLIN HFA) 108 (90 Base) MCG/ACT inhaler Inhale 1-2 puffs into the lungs every 6 (six) hours as needed for wheezing or shortness of breath.    . cholecalciferol (VITAMIN D) 1000 units tablet Take 1,000 Units by mouth daily.    Marland Kitchen HYDROcodone-acetaminophen (NORCO/VICODIN) 5-325 MG tablet Take 1 tablet by mouth every 6 (six) hours as needed for moderate pain. 30 tablet 0  .  levothyroxine (SYNTHROID, LEVOTHROID) 100 MCG tablet Take 100 mcg by mouth daily before breakfast.     . meloxicam (MOBIC) 7.5 MG tablet Take 1 tablet by mouth 2 (two) times daily as needed.    . traMADol (ULTRAM) 50 MG tablet Take 1 tablet by mouth 3 (three) times daily as needed.     No current facility-administered medications for this visit.     Allergies  Allergen Reactions  . Oxycodone-Acetaminophen Itching and Rash  . Demerol [Meperidine] Itching  . Propoxyphene N-Acetaminophen     Review of Systems: See HPI  BP 134/85 (BP Location: Left Arm, Patient Position: Sitting, Cuff Size: Normal)   Pulse (!) 101   Temp 98.8 F (37.1 C) (Oral)   Resp 16   Ht 5\' 4"  (1.626 m)   Wt 140 lb (63.5 kg)   SpO2 96% Comment: RA  BMI 24.03 kg/m  Physical Exam: 63 year old woman in obvious discomfort but no acute distress Alert and oriented x3 with no focal neurologic deficit HEENT wearing surgical mask Neck well-healed scar, no  adenopathy Cardiac regular rate and rhythm normal S1 and S2 Lungs clear with equal breath sounds bilaterally, no wheezing No peripheral edema  Diagnostic Tests: CT CHEST WITH CONTRAST  TECHNIQUE: Multidetector CT imaging of the chest was performed during intravenous contrast administration.  CONTRAST:  48mL OMNIPAQUE IOHEXOL 300 MG/ML  SOLN  COMPARISON:  Chest x-ray 11/04/2018  FINDINGS: Cardiovascular: The heart size is normal. Tiny pericardial effusion. Coronary artery calcification is evident. Atherosclerotic calcification is noted in the wall of the thoracic aorta.  Mediastinum/Nodes: Large confluent mediastinal mass is identified involving the left hilum and AP window. At the level of the AP window, the lesion measures 5.4 x 5.7 cm. As it tracks inferiorly into the left hilum, the left main pulmonary artery is markedly attenuated by the disease, narrowing the lumen down to about 3 x 3 mm diameter. Left upper lobe pulmonary artery is obliterated.  Subcarinal lymphadenopathy measures up to 13 mm short axis. No right hilar adenopathy. No axillary or supraclavicular lymphadenopathy. The esophagus has normal imaging features.  Lungs/Pleura: 7.8 x 4.5 cm soft tissue mass is identified in the medial left upper lobe with associated left upper lobe collapse. 12 mm irregular nodular opacity is identified in the right middle lobe (83/4). No right pleural effusion. 4 mm irregular nodule identified inferior lingula (97/4). 7 mm irregular nodule identified in the left base (109/4). No left pleural effusion.  Upper Abdomen: Haziness is identified in the fat around the upper abdominal aorta and in the hepato duodenal ligament. 11 mm left adrenal nodule is well seen on coronal image 65 of series 5.  Musculoskeletal: No worrisome lytic or sclerotic osseous abnormality.  IMPRESSION: 1. Large medial left upper lobe pulmonary mass with associated left hilar, subcarinal and  AP window bulky nodal/soft tissue disease. Left hilar adenopathy circumferentially encases and dramatically attenuates the left main pulmonary artery and obliterates apicoposterior and anterior bronchi to the left upper lobe. 2. Apical left upper lobe collapse. 3. Haziness with small lymph nodes identified in the upper abdomen with 11 mm left adrenal nodule associated. Dedicated abdomen and pelvis CT may be warranted to assess for metastatic disease. 4.  Aortic Atherosclerois (ICD10-170.0)  These results will be called to the ordering clinician or representative by the Radiologist Assistant, and communication documented in the PACS or zVision Dashboard.   Electronically Signed   By: Misty Stanley M.D.   On: 11/24/2018 17:50 NUCLEAR MEDICINE PET SKULL BASE  TO THIGH  TECHNIQUE: 7.0 mCi F-18 FDG was injected intravenously. Full-ring PET imaging was performed from the skull base to thigh after the radiotracer. CT data was obtained and used for attenuation correction and anatomic localization.  Fasting blood glucose: 104 mg/dl  COMPARISON:  CT 11/24/2018  FINDINGS: Mediastinal blood pool activity: SUV max 2.2  Liver activity: SUV max NA  NECK: No hypermetabolic lymph nodes in the neck.  Incidental CT findings: none  CHEST: Hypermetabolic RIGHT AP window mass surrounding the LEFT pulmonary artery is intensely hypermetabolic with SUV max equal 30.9. Mass measures approximately 6.5 by 5.5 cm.  There is collapse of the LEFT upper lobe secondary to AP window mass surrounding the upper lobe bronchus. In the periphery of this collapsed LEFT upper lobe there is intense hypermetabolic focus along the anterior chest wall SUV max equal 27.4. This hypermetabolic region measures approximately 4 cm.  No clear evidence of extension through the chest wall. Mass abuts the pleural surface.  No hypermetabolic supraclavicular nodes.  Within the RIGHT middle lobe elongated  nodule measuring 13 mm (image 77/4) has associated metabolic activity with SUV max equal 3.7.  Incidental CT findings: none  ABDOMEN/PELVIS: No abnormal hypermetabolic activity within the liver, pancreas, adrenal glands, or spleen. No hypermetabolic lymph nodes in the abdomen or pelvis.  Hypermetabolic focus in the LEFT abdomen peritoneal space along the anti mesenteric border of the descending colon corresponds to peritoneal nodule measuring 7 mm (image 129/4) with SUV max equal 5.8.  No additional evidence peritoneal metastasis.  Incidental CT findings:  SKELETON: Large expansile lytic lesion in the RIGHT iliac bone measures up to 6 cm with SUV max equal 15.5. There is soft tissue expansion of the bone lesion abutting the RIGHT gluteus muscle.  Incidental CT findings: none  IMPRESSION: 1. Hypermetabolic LEFT suprahilar/AP window mass with intense metabolic activity is most suggestive of small cell lung cancer. 2. Hypermetabolic focus in the periphery of the collapsed LEFT upper lobe likely represents the primary bronchogenic carcinoma. 3. Evidence of contralateral lung metastasis in the RIGHT middle lobe. 4. Solitary peritoneal metastasis along the descending colon. 5. Skeletal metastasis with solitary large expansile lesion in the RIGHT iliac bone. 6. Stage IV carcinoma.   Electronically Signed   By: Suzy Bouchard M.D.   On: 12/09/2018 09:34 I personally reviewed the CT and PET/CT images and concur with the findings noted above  Impression: Yolanda Miller is a 63 year old woman with a history of tobacco abuse who presents with a 2 to 31-month history of shortness of breath, cough, low-grade fevers, general malaise, poor appetite, weight loss, and right hip and buttock pain.  Work-up has revealed a left upper lobe mass with massive central adenopathy and distant metastasis.  Findings are consistent with a primary bronchogenic carcinoma, most likely small cell,  but non-small cell is also a possibility.  She needs a biopsy for diagnosis so that appropriate treatment can be initiated.  I recommended to her that we do a bronchoscopy, endobronchial ultrasound, and possible mediastinoscopy.  I do not think a mediastinoscopy will be necessary, but I also would not want to delay her initiation of treatment if the bronchoscopy is nondiagnostic.  I described the general nature of the procedure to her including the plan to do it under general anesthesia.  We will plan to do it on an outpatient basis.  I informed her of the indications, risks, benefits, and alternatives.  She understands the risks include, but not limited to death, MI, stroke,  bleeding, possible need for transfusion, infection, pneumothorax, as well as the possibility of other unforeseeable complications.  She accepts the risks and wishes to proceed.  Right iliac metastasis-might benefit from referral to orthopedist.  Will defer to Dr. Delton Coombes.  Plan: Bronchoscopy, endobronchial ultrasound, and possible mediastinoscopy on Friday, 12/17/2018   Melrose Nakayama, MD [facility] [phone]

## 2018-12-13 NOTE — Progress Notes (Signed)
PCP is [facility] Referring Provider is Derek Jack, MD  Chief Complaint  Patient presents with  . Lung Mass    LULobe...CT CHEST 4/29, PET 12/08/18    HPI: Yolanda Miller is referred for consideration for biopsy.  Yolanda Miller is a 63 year old woman with a history of tobacco abuse, anxiety, depression, migraines, and thyroidectomy.  She smokes much 2 packs/day starting at age 75 and quit just a few days ago (45-pack-year).  She was in her usual state of health until February when she presented with fever, cough, hoarseness, and shortness of breath.  Chest x-ray showed a possible pneumonia.  Test for coronavirus and influenza were negative.  She did not improve with treatment.  A repeat chest x-ray showed volume loss in the left upper lobe.  A CT of the chest showed a large left hilar mass and mediastinal mass.  A PET CT showed a hypermetabolic left upper lobe mass and AP window mass.  There also was a large expansile lytic lesion in the right iliac bone and a retroperitoneal metastasis.  She also complains of a poor appetite, 11 pound weight loss over the past 3 months, decreased energy, and right hip and buttock pain.  Zubrod Score: At the time of surgery this patient's most appropriate activity status/level should be described as: []     0    Normal activity, no symptoms []     1    Restricted in physical strenuous activity but ambulatory, able to do out light work [x]     2    Ambulatory and capable of self care, unable to do work activities, up and about >50 % of waking hours                              []     3    Only limited self care, in bed greater than 50% of waking hours []     4    Completely disabled, no self care, confined to bed or chair []     5    Moribund  Past Medical History:  Diagnosis Date  . Anxiety   . Depression   . Migraines   . Pneumonia   . Thyroid disease     Past Surgical History:  Procedure Laterality Date  . ABDOMINAL  HYSTERECTOMY    . CESAREAN SECTION    . LEG SURGERY Left 2004  . THYROID CYST EXCISION    . THYROIDECTOMY     parathyroid as well    Family History  Problem Relation Age of Onset  . Cancer Mother   . Cancer Father   . Cancer Brother     Social History Social History   Tobacco Use  . Smoking status: Current Every Day Smoker    Packs/day: 0.50  . Smokeless tobacco: Never Used  Substance Use Topics  . Alcohol use: Not Currently    Comment: occasionally  . Drug use: Not on file    Current Outpatient Medications  Medication Sig Dispense Refill  . albuterol (PROVENTIL HFA;VENTOLIN HFA) 108 (90 Base) MCG/ACT inhaler Inhale 1-2 puffs into the lungs every 6 (six) hours as needed for wheezing or shortness of breath.    . cholecalciferol (VITAMIN D) 1000 units tablet Take 1,000 Units by mouth daily.    Marland Kitchen HYDROcodone-acetaminophen (NORCO/VICODIN) 5-325 MG tablet Take 1 tablet by mouth every 6 (six) hours as needed for moderate pain. 30 tablet 0  .  levothyroxine (SYNTHROID, LEVOTHROID) 100 MCG tablet Take 100 mcg by mouth daily before breakfast.     . meloxicam (MOBIC) 7.5 MG tablet Take 1 tablet by mouth 2 (two) times daily as needed.    . traMADol (ULTRAM) 50 MG tablet Take 1 tablet by mouth 3 (three) times daily as needed.     No current facility-administered medications for this visit.     Allergies  Allergen Reactions  . Oxycodone-Acetaminophen Itching and Rash  . Demerol [Meperidine] Itching  . Propoxyphene N-Acetaminophen     Review of Systems: See HPI  BP 134/85 (BP Location: Left Arm, Patient Position: Sitting, Cuff Size: Normal)   Pulse (!) 101   Temp 98.8 F (37.1 C) (Oral)   Resp 16   Ht 5\' 4"  (1.626 m)   Wt 140 lb (63.5 kg)   SpO2 96% Comment: RA  BMI 24.03 kg/m  Physical Exam: 63 year old woman in obvious discomfort but no acute distress Alert and oriented x3 with no focal neurologic deficit HEENT wearing surgical mask Neck well-healed scar, no  adenopathy Cardiac regular rate and rhythm normal S1 and S2 Lungs clear with equal breath sounds bilaterally, no wheezing No peripheral edema  Diagnostic Tests: CT CHEST WITH CONTRAST  TECHNIQUE: Multidetector CT imaging of the chest was performed during intravenous contrast administration.  CONTRAST:  74mL OMNIPAQUE IOHEXOL 300 MG/ML  SOLN  COMPARISON:  Chest x-ray 11/04/2018  FINDINGS: Cardiovascular: The heart size is normal. Tiny pericardial effusion. Coronary artery calcification is evident. Atherosclerotic calcification is noted in the wall of the thoracic aorta.  Mediastinum/Nodes: Large confluent mediastinal mass is identified involving the left hilum and AP window. At the level of the AP window, the lesion measures 5.4 x 5.7 cm. As it tracks inferiorly into the left hilum, the left main pulmonary artery is markedly attenuated by the disease, narrowing the lumen down to about 3 x 3 mm diameter. Left upper lobe pulmonary artery is obliterated.  Subcarinal lymphadenopathy measures up to 13 mm short axis. No right hilar adenopathy. No axillary or supraclavicular lymphadenopathy. The esophagus has normal imaging features.  Lungs/Pleura: 7.8 x 4.5 cm soft tissue mass is identified in the medial left upper lobe with associated left upper lobe collapse. 12 mm irregular nodular opacity is identified in the right middle lobe (83/4). No right pleural effusion. 4 mm irregular nodule identified inferior lingula (97/4). 7 mm irregular nodule identified in the left base (109/4). No left pleural effusion.  Upper Abdomen: Haziness is identified in the fat around the upper abdominal aorta and in the hepato duodenal ligament. 11 mm left adrenal nodule is well seen on coronal image 65 of series 5.  Musculoskeletal: No worrisome lytic or sclerotic osseous abnormality.  IMPRESSION: 1. Large medial left upper lobe pulmonary mass with associated left hilar, subcarinal and  AP window bulky nodal/soft tissue disease. Left hilar adenopathy circumferentially encases and dramatically attenuates the left main pulmonary artery and obliterates apicoposterior and anterior bronchi to the left upper lobe. 2. Apical left upper lobe collapse. 3. Haziness with small lymph nodes identified in the upper abdomen with 11 mm left adrenal nodule associated. Dedicated abdomen and pelvis CT may be warranted to assess for metastatic disease. 4.  Aortic Atherosclerois (ICD10-170.0)  These results will be called to the ordering clinician or representative by the Radiologist Assistant, and communication documented in the PACS or zVision Dashboard.   Electronically Signed   By: Misty Stanley M.D.   On: 11/24/2018 17:50 NUCLEAR MEDICINE PET SKULL BASE  TO THIGH  TECHNIQUE: 7.0 mCi F-18 FDG was injected intravenously. Full-ring PET imaging was performed from the skull base to thigh after the radiotracer. CT data was obtained and used for attenuation correction and anatomic localization.  Fasting blood glucose: 104 mg/dl  COMPARISON:  CT 11/24/2018  FINDINGS: Mediastinal blood pool activity: SUV max 2.2  Liver activity: SUV max NA  NECK: No hypermetabolic lymph nodes in the neck.  Incidental CT findings: none  CHEST: Hypermetabolic RIGHT AP window mass surrounding the LEFT pulmonary artery is intensely hypermetabolic with SUV max equal 30.9. Mass measures approximately 6.5 by 5.5 cm.  There is collapse of the LEFT upper lobe secondary to AP window mass surrounding the upper lobe bronchus. In the periphery of this collapsed LEFT upper lobe there is intense hypermetabolic focus along the anterior chest wall SUV max equal 27.4. This hypermetabolic region measures approximately 4 cm.  No clear evidence of extension through the chest wall. Mass abuts the pleural surface.  No hypermetabolic supraclavicular nodes.  Within the RIGHT middle lobe elongated  nodule measuring 13 mm (image 77/4) has associated metabolic activity with SUV max equal 3.7.  Incidental CT findings: none  ABDOMEN/PELVIS: No abnormal hypermetabolic activity within the liver, pancreas, adrenal glands, or spleen. No hypermetabolic lymph nodes in the abdomen or pelvis.  Hypermetabolic focus in the LEFT abdomen peritoneal space along the anti mesenteric border of the descending colon corresponds to peritoneal nodule measuring 7 mm (image 129/4) with SUV max equal 5.8.  No additional evidence peritoneal metastasis.  Incidental CT findings:  SKELETON: Large expansile lytic lesion in the RIGHT iliac bone measures up to 6 cm with SUV max equal 15.5. There is soft tissue expansion of the bone lesion abutting the RIGHT gluteus muscle.  Incidental CT findings: none  IMPRESSION: 1. Hypermetabolic LEFT suprahilar/AP window mass with intense metabolic activity is most suggestive of small cell lung cancer. 2. Hypermetabolic focus in the periphery of the collapsed LEFT upper lobe likely represents the primary bronchogenic carcinoma. 3. Evidence of contralateral lung metastasis in the RIGHT middle lobe. 4. Solitary peritoneal metastasis along the descending colon. 5. Skeletal metastasis with solitary large expansile lesion in the RIGHT iliac bone. 6. Stage IV carcinoma.   Electronically Signed   By: Suzy Bouchard M.D.   On: 12/09/2018 09:34 I personally reviewed the CT and PET/CT images and concur with the findings noted above  Impression: Yolanda Miller is a 63 year old woman with a history of tobacco abuse who presents with a 2 to 28-month history of shortness of breath, cough, low-grade fevers, general malaise, poor appetite, weight loss, and right hip and buttock pain.  Work-up has revealed a left upper lobe mass with massive central adenopathy and distant metastasis.  Findings are consistent with a primary bronchogenic carcinoma, most likely small cell,  but non-small cell is also a possibility.  She needs a biopsy for diagnosis so that appropriate treatment can be initiated.  I recommended to her that we do a bronchoscopy, endobronchial ultrasound, and possible mediastinoscopy.  I do not think a mediastinoscopy will be necessary, but I also would not want to delay her initiation of treatment if the bronchoscopy is nondiagnostic.  I described the general nature of the procedure to her including the plan to do it under general anesthesia.  We will plan to do it on an outpatient basis.  I informed her of the indications, risks, benefits, and alternatives.  She understands the risks include, but not limited to death, MI, stroke,  bleeding, possible need for transfusion, infection, pneumothorax, as well as the possibility of other unforeseeable complications.  She accepts the risks and wishes to proceed.  Right iliac metastasis-might benefit from referral to orthopedist.  Will defer to Dr. Delton Coombes.  Plan: Bronchoscopy, endobronchial ultrasound, and possible mediastinoscopy on Friday, 12/17/2018   Melrose Nakayama, MD [facility] [phone]

## 2018-12-14 ENCOUNTER — Inpatient Hospital Stay (HOSPITAL_COMMUNITY): Payer: BC Managed Care – PPO | Admitting: Hematology

## 2018-12-14 ENCOUNTER — Encounter (HOSPITAL_COMMUNITY): Payer: Self-pay | Admitting: Hematology

## 2018-12-14 DIAGNOSIS — R918 Other nonspecific abnormal finding of lung field: Secondary | ICD-10-CM

## 2018-12-14 DIAGNOSIS — K5900 Constipation, unspecified: Secondary | ICD-10-CM

## 2018-12-14 DIAGNOSIS — N280 Ischemia and infarction of kidney: Secondary | ICD-10-CM | POA: Diagnosis not present

## 2018-12-14 DIAGNOSIS — Z809 Family history of malignant neoplasm, unspecified: Secondary | ICD-10-CM

## 2018-12-14 DIAGNOSIS — C801 Malignant (primary) neoplasm, unspecified: Secondary | ICD-10-CM

## 2018-12-14 DIAGNOSIS — C7951 Secondary malignant neoplasm of bone: Secondary | ICD-10-CM | POA: Diagnosis not present

## 2018-12-14 DIAGNOSIS — M25551 Pain in right hip: Secondary | ICD-10-CM

## 2018-12-14 DIAGNOSIS — F17210 Nicotine dependence, cigarettes, uncomplicated: Secondary | ICD-10-CM

## 2018-12-14 DIAGNOSIS — Z90710 Acquired absence of both cervix and uterus: Secondary | ICD-10-CM

## 2018-12-14 DIAGNOSIS — C786 Secondary malignant neoplasm of retroperitoneum and peritoneum: Secondary | ICD-10-CM

## 2018-12-14 DIAGNOSIS — R1032 Left lower quadrant pain: Secondary | ICD-10-CM | POA: Diagnosis not present

## 2018-12-14 MED ORDER — HYDROMORPHONE HCL 2 MG PO TABS: 2.0000 mg | ORAL_TABLET | ORAL | 0 refills | Status: DC | PRN

## 2018-12-14 NOTE — Assessment & Plan Note (Signed)
1.  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 - PET CT scan on 12/09/2018 shows hypermetabolic left suprahilar/AP window mass with intense hypermetabolic activity most suggestive of small cell lung cancer.  Hypermetabolic focus in the periphery of the collapsed left upper lobe likely represents primary carcinoma.  Solitary peritoneal metastasis along with descending colon.  The skeletal metastasis with solitary large expansile lesion in the right iliac bone. - MRI of the lumbar spine on 12/01/2018 shows large metastatic deposit in the posterior right ilium with extension into the soft tissue. -Brain MRI on 12/01/2018 shows no intracranial metastasis. -Beta-hCG tumor marker was elevated at 1289.  Patient had complete hysterectomy few years ago.  LDH and AFP were normal. - She was seen by Dr. Roxan Hockey and is scheduled for bronchoscopy and biopsy on 12/17/2018. -I will see her back next week to discuss the results of the biopsy and further management plan.  2.  Right hip pain: -She is taking hydrocodone 5 mg every 6 hours which is not helping.  She doubled up on the dose but still did not help. -She is allergic to oxycodone. - I will start her on Dilaudid 2 mg every 3-4 hours as needed pain. - I have called and talked to [facility] for palliative radiation to the right hip area.  He kindly agreed to see this patient as soon as possible.

## 2018-12-14 NOTE — Pre-Procedure Instructions (Signed)
[place]  12/14/2018      [place], [place] [address] [place] [address] Phone: [phone] Fax: [phone]    Your procedure is scheduled on Friday 12-17-18 from [id number]  Report to [facility] at [place] A.[facility].  Call this number if you have problems the morning of surgery:  [phone]   Remember:  Do not eat or drink after midnight.     Take these medicines the morning of surgery with A SIP OF WATER :              Levothyroxine (SYNTHROID/LEVOTHYROID)               As needed:              Albuterol(Proventil HFA/Ventolin HFA)              Hydrocodone-acetaminophen(Norco/Vicodin)             As of today, STOP taking MOBIC, any Aspirin (unless otherwise instructed by your surgeon), Aleve, Naproxen, Ibuprofen, Motrin, Advil, Goody's, BC's, all herbal medications, fish oil, and all vitamins.             Bring your inhaler with you .    Do not wear jewelry, make-up or nail polish.  Do not wear lotions, powders, or perfumes, or deodorant.  Do not shave 48 hours prior to surgery.  Men may shave face and neck.  Do not bring valuables to the hospital.  [facility] is not responsible for any belongings or valuables.  Contacts, dentures or bridgework may not be worn into surgery.  Leave your suitcase in the car.  After surgery it may be brought to your room.  If you are a smoker, do not smoke 24hrs prior to surgery.  For patients admitted to the hospital, discharge time will be determined by your treatment team.  Patients discharged the day of surgery will not be allowed to drive home. Remember that you must have someone to transport you home after your surgery,and remain with you for 24hrs if you are discharged the same day.  Special instructions:   [facility]- Preparing For Surgery  Before surgery, you can play an important role. Because skin is not sterile, your skin needs to  be as free of germs as possible. You can reduce the number of germs on your skin by washing with CHG (chlorahexidine gluconate) Soap before surgery.  CHG is an antiseptic cleaner which kills germs and bonds with the skin to continue killing germs even after washing.    Oral Hygiene is also important to reduce your risk of infection.  Remember - BRUSH YOUR TEETH THE MORNING OF SURGERY WITH YOUR REGULAR TOOTHPASTE  Please do not use if you have an allergy to CHG or antibacterial soaps. If your skin becomes reddened/irritated stop using the CHG.  Do not shave (including legs and underarms) for at least 48 hours prior to first CHG shower. It is OK to shave your face.  Please follow these instructions carefully.   1. Shower the NIGHT BEFORE SURGERY and the MORNING OF SURGERY with CHG.   2. If you chose to wash your hair, wash your hair first as usual with your normal shampoo.  3. After you shampoo, rinse your hair and body thoroughly to remove the shampoo.  4. Use CHG as you would any other liquid soap. You can apply CHG directly to the skin and  wash gently with a scrungie or a clean washcloth.   5. Apply the CHG Soap to your body ONLY FROM THE NECK DOWN.  Do not use on open wounds or open sores. Avoid contact with your eyes, ears, mouth and genitals (private parts). Wash Face and genitals (private parts)  with your normal soap.  6. Wash thoroughly, paying special attention to the area where your surgery will be performed.  7. Thoroughly rinse your body with warm water from the neck down.  8. DO NOT shower/wash with your normal soap after using and rinsing off the CHG Soap.  9. Pat yourself dry with a CLEAN TOWEL.  10. Wear CLEAN PAJAMAS to bed the night before surgery, wear comfortable clothes the morning of surgery  11. Place CLEAN SHEETS on your bed the night of your first shower and DO NOT SLEEP WITH PETS.  Day of Surgery:  Do not apply any deodorants/lotions.  Please wear clean  clothes to the hospital/surgery center.   Remember to brush your teeth WITH YOUR REGULAR TOOTHPASTE.  Please read over the following fact sheets that you were given. Pain Booklet, Coughing and Deep Breathing, MRSA Information and Surgical Site Infection Prevention

## 2018-12-14 NOTE — Progress Notes (Signed)
[place], [place] [address]   CLINIC:  Medical Oncology/Hematology  PCP:  [facility] [place] [address] [phone]   REASON FOR VISIT:  Follow-up for lung mass     INTERVAL HISTORY:  Ms. Yolanda Miller 63 y.o. female returns for routine follow-up. She is here today alone. She states that she has had constipation since her last visit . Denies any nausea, vomiting, or diarrhea.  She reports hydrocodone is not helping her pain.  She tried doubling up the pills but without help.  Denies any other new pains. Had not noticed any recent bleeding such as epistaxis, hematuria or hematochezia. Denies recent chest pain on exertion, shortness of breath on minimal exertion, pre-syncopal episodes, or palpitations. Denies any numbness or tingling in hands or feet. Denies any recent fevers, infections, or recent hospitalizations. Patient reports appetite at 0% and energy level at 0%.    REVIEW OF SYSTEMS:  Review of Systems  Respiratory: Positive for shortness of breath.   Gastrointestinal: Positive for constipation.  Musculoskeletal: Positive for back pain.  All other systems reviewed and are negative.    PAST MEDICAL/SURGICAL HISTORY:  Past Medical History:  Diagnosis Date  . Anxiety   . Depression   . Migraines   . Pneumonia   . Thyroid disease    Past Surgical History:  Procedure Laterality Date  . ABDOMINAL HYSTERECTOMY    . CESAREAN SECTION    . LEG SURGERY Left 2004  . THYROID CYST EXCISION    . THYROIDECTOMY     parathyroid as well     SOCIAL HISTORY:  Social History   Socioeconomic History  . Marital status: Divorced    Spouse name: Not on file  . Number of children: Not on file  . Years of education: Not on file  . Highest education level: Not on file  Occupational History  . Not on file  Social Needs  . Financial resource strain: Not on file  . Food insecurity:    Worry: Not on  file    Inability: Not on file  . Transportation needs:    Medical: Not on file    Non-medical: Not on file  Tobacco Use  . Smoking status: Current Every Day Smoker    Packs/day: 1.00    Years: 45.00    Pack years: 45.00  . Smokeless tobacco: Never Used  Substance and Sexual Activity  . Alcohol use: Not Currently    Comment: occasionally  . Drug use: Not on file  . Sexual activity: Not on file  Lifestyle  . Physical activity:    Days per week: Not on file    Minutes per session: Not on file  . Stress: Not on file  Relationships  . Social connections:    Talks on phone: Not on file    Gets together: Not on file    Attends religious service: Not on file    Active member of club or organization: Not on file    Attends meetings of clubs or organizations: Not on file    Relationship status: Not on file  . Intimate partner violence:    Fear of current or ex partner: Not on file    Emotionally abused: Not on file    Physically abused: Not on file    Forced sexual activity: Not on file  Other Topics Concern  . Not on file  Social History Narrative  . Not on  file    FAMILY HISTORY:  Family History  Problem Relation Age of Onset  . Cancer Mother   . Cancer Father   . Cancer Brother     CURRENT MEDICATIONS:  Outpatient Encounter Medications as of 12/14/2018  Medication Sig  . albuterol (PROVENTIL HFA;VENTOLIN HFA) 108 (90 Base) MCG/ACT inhaler Inhale 1-2 puffs into the lungs every 6 (six) hours as needed for wheezing or shortness of breath.  . cholecalciferol (VITAMIN D) 1000 units tablet Take 1,000 Units by mouth daily.  Marland Kitchen doxylamine, Sleep, (UNISOM) 25 MG tablet Take 25 mg by mouth at bedtime as needed for sleep.  . hydrOXYzine (ATARAX/VISTARIL) 25 MG tablet Take 25 mg by mouth every 8 (eight) hours as needed for anxiety.  Marland Kitchen levothyroxine (SYNTHROID, LEVOTHROID) 100 MCG tablet Take 100 mcg by mouth daily before breakfast.   . [DISCONTINUED] HYDROcodone-acetaminophen  (NORCO/VICODIN) 5-325 MG tablet Take 1 tablet by mouth every 6 (six) hours as needed for moderate pain.  Marland Kitchen HYDROmorphone (DILAUDID) 2 MG tablet Take 1 tablet (2 mg total) by mouth every 4 (four) hours as needed for severe pain.  Marland Kitchen ibuprofen (ADVIL) 200 MG tablet Take 400 mg by mouth every 6 (six) hours as needed for headache or moderate pain.  . meloxicam (MOBIC) 7.5 MG tablet Take 1 tablet by mouth 2 (two) times daily as needed for pain.    No facility-administered encounter medications on file as of 12/14/2018.     ALLERGIES:  Allergies  Allergen Reactions  . Oxycodone-Acetaminophen Itching and Rash  . Demerol [Meperidine] Itching  . Propoxyphene N-Acetaminophen Itching and Rash     PHYSICAL EXAM:  ECOG Performance status: 1  Vitals:   12/14/18 1400  BP: 134/71  Pulse: 98  Resp: 16  Temp: 98.1 F (36.7 C)  SpO2: 99%   Filed Weights   12/14/18 1400  Weight: 135 lb (61.2 kg)    Physical Exam Vitals signs reviewed.  Constitutional:      Appearance: Normal appearance.  Cardiovascular:     Rate and Rhythm: Normal rate and regular rhythm.     Heart sounds: Normal heart sounds.  Pulmonary:     Effort: Pulmonary effort is normal.     Breath sounds: Normal breath sounds.  Abdominal:     General: There is no distension.     Palpations: Abdomen is soft. There is no mass.  Musculoskeletal:        General: No swelling.  Skin:    General: Skin is warm.  Neurological:     General: No focal deficit present.     Mental Status: She is alert.  Psychiatric:        Mood and Affect: Mood normal.        Behavior: Behavior normal.      LABORATORY DATA:  I have reviewed the labs as listed.  CBC    Component Value Date/Time   WBC 12.4 (H) 10/15/2018 1821   RBC 3.93 10/15/2018 1821   HGB 12.1 10/15/2018 1821   HCT 37.7 10/15/2018 1821   PLT 378 10/15/2018 1821   MCV 95.9 10/15/2018 1821   MCH 30.8 10/15/2018 1821   MCHC 32.1 10/15/2018 1821   RDW 12.5 10/15/2018 1821    LYMPHSABS 4.6 (H) 10/15/2018 1821   MONOABS 1.0 10/15/2018 1821   EOSABS 0.1 10/15/2018 1821   BASOSABS 0.0 10/15/2018 1821   CMP Latest Ref Rng & Units 12/01/2018 10/15/2018 09/12/2018  Glucose 70 - 99 mg/dL - 106(H) 113(H)  BUN 8 -  23 mg/dL - 13 15  Creatinine 0.44 - 1.00 mg/dL 0.60 0.66 0.65  Sodium 135 - 145 mmol/L - 136 136  Potassium 3.5 - 5.1 mmol/L - 3.6 4.0  Chloride 98 - 111 mmol/L - 101 103  CO2 22 - 32 mmol/L - 24 22  Calcium 8.9 - 10.3 mg/dL - 9.4 8.8(L)  Total Protein 6.5 - 8.1 g/dL - 8.3(H) 7.8  Total Bilirubin 0.3 - 1.2 mg/dL - 0.4 0.7  Alkaline Phos 38 - 126 U/L - 70 62  AST 15 - 41 U/L - 14(L) 18  ALT 0 - 44 U/L - 15 28       DIAGNOSTIC IMAGING:  I have independently reviewed the scans and discussed with the patient.   I have reviewed Venita Lick LPN's note and agree with the documentation.  I personally performed a face-to-face visit, made revisions and my assessment and plan is as follows.    ASSESSMENT & PLAN:   Mass of upper lobe of left lung 1.  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 - PET CT scan on 12/09/2018 shows hypermetabolic left suprahilar/AP window mass with intense hypermetabolic activity most suggestive of small cell lung cancer.  Hypermetabolic focus in the periphery of the collapsed left upper lobe likely represents primary carcinoma.  Solitary peritoneal metastasis along with descending colon.  The skeletal metastasis with solitary large expansile lesion in the right iliac bone. - MRI of the lumbar spine on 12/01/2018 shows large metastatic deposit in the posterior right ilium with extension into the soft tissue. -Brain MRI on 12/01/2018 shows no intracranial metastasis. -Beta-hCG tumor marker was elevated at 1289.  Patient had complete  hysterectomy few years ago.  LDH and AFP were normal. - She was seen by Dr. Roxan Hockey and is scheduled for bronchoscopy and biopsy on 12/17/2018. -I will see her back next week to discuss the results of the biopsy and further management plan.  2.  Right hip pain: -She is taking hydrocodone 5 mg every 6 hours which is not helping.  She doubled up on the dose but still did not help. -She is allergic to oxycodone. - I will start her on Dilaudid 2 mg every 3-4 hours as needed pain. - I have called and talked to [facility] for palliative radiation to the right hip area.  He kindly agreed to see this patient as soon as possible.  Total time spent is 40 minutes with more than 50% of the time spent face-to-face discussing scan results, management options and coordination of care.    Orders placed this encounter:  No orders of the defined types were placed in this encounter.     Derek Jack, MD [facility] [phone]

## 2018-12-14 NOTE — Patient Instructions (Signed)
[facility] at [facility] Discharge Instructions  You were seen today by Dr. Delton Coombes. He went over your recent scan results. He will schedule you for radiation for your right hip. He will see you back after your biopsy for follow up.  He is going to send you in a new pain medication that you can take every 3-4 hours. If this makes you drowsy then cut the tablet in half.  Thank you for choosing [facility] at [facility] to provide your oncology and hematology care.  To afford each patient quality time with our provider, please arrive at least 15 minutes before your scheduled appointment time.   If you have a lab appointment with the [facility] please come in thru the  Main Entrance and check in at the main information desk  You need to re-schedule your appointment should you arrive 10 or more minutes late.  We strive to give you quality time with our providers, and arriving late affects you and other patients whose appointments are after yours.  Also, if you no show three or more times for appointments you may be dismissed from the clinic at the providers discretion.     Again, thank you for choosing [facility].  Our hope is that these requests will decrease the amount of time that you wait before being seen by our physicians.       _____________________________________________________________  Should you have questions after your visit to [facility], please contact our office at [phone] between the hours of 8:00 a.m. and 4:30 p.m.  Voicemails left after 4:00 p.m. will not be returned until the following business day.  For prescription refill requests, have your pharmacy contact our office and allow 72 hours.    Cancer Center Support Programs:   > Cancer Support Group  2nd Tuesday of the month 1pm-2pm, Journey Room

## 2018-12-15 ENCOUNTER — Encounter (HOSPITAL_COMMUNITY)
Admission: RE | Admit: 2018-12-15 | Discharge: 2018-12-15 | Disposition: A | Discharge status: Home / Self Care | Payer: BC Managed Care – PPO | Source: Ambulatory Visit | Attending: Thoracic Surgery (Cardiothoracic Vascular Surgery) | Admitting: Thoracic Surgery (Cardiothoracic Vascular Surgery)

## 2018-12-15 ENCOUNTER — Ambulatory Visit (HOSPITAL_COMMUNITY)
Admission: RE | Admit: 2018-12-15 | Discharge: 2018-12-15 | Disposition: A | Discharge status: Home / Self Care | Payer: BC Managed Care – PPO | Source: Ambulatory Visit | Attending: Thoracic Surgery (Cardiothoracic Vascular Surgery) | Admitting: Thoracic Surgery (Cardiothoracic Vascular Surgery)

## 2018-12-15 ENCOUNTER — Encounter (HOSPITAL_COMMUNITY): Payer: Self-pay | Admitting: Lab

## 2018-12-15 ENCOUNTER — Encounter (HOSPITAL_COMMUNITY): Payer: Self-pay | Admitting: Physician Assistant

## 2018-12-15 ENCOUNTER — Other Ambulatory Visit (HOSPITAL_COMMUNITY)
Admission: RE | Admit: 2018-12-15 | Discharge: 2018-12-15 | Disposition: A | Discharge status: Home / Self Care | Payer: BC Managed Care – PPO | Source: Ambulatory Visit | Attending: Thoracic Surgery (Cardiothoracic Vascular Surgery) | Admitting: Thoracic Surgery (Cardiothoracic Vascular Surgery)

## 2018-12-15 ENCOUNTER — Encounter (HOSPITAL_COMMUNITY): Payer: Self-pay

## 2018-12-15 ENCOUNTER — Other Ambulatory Visit: Payer: Self-pay

## 2018-12-15 DIAGNOSIS — R918 Other nonspecific abnormal finding of lung field: Secondary | ICD-10-CM

## 2018-12-15 DIAGNOSIS — Z1159 Encounter for screening for other viral diseases: Secondary | ICD-10-CM | POA: Insufficient documentation

## 2018-12-15 HISTORY — DX: Dyspnea, unspecified: R06.00

## 2018-12-15 HISTORY — DX: Personal history of other diseases of the digestive system: Z87.19

## 2018-12-15 HISTORY — DX: Other complications of anesthesia, initial encounter: T88.59XA

## 2018-12-15 LAB — COMPREHENSIVE METABOLIC PANEL
ALT: 14 U/L (ref 0–44)
AST: 15 U/L (ref 15–41)
Albumin: 4.2 g/dL (ref 3.5–5.0)
Alkaline Phosphatase: 72 U/L (ref 38–126)
Anion gap: 12 (ref 5–15)
BUN: 11 mg/dL (ref 8–23)
CO2: 23 mmol/L (ref 22–32)
Calcium: 9.4 mg/dL (ref 8.9–10.3)
Chloride: 103 mmol/L (ref 98–111)
Creatinine, Ser: 0.74 mg/dL (ref 0.44–1.00)
GFR calc Af Amer: >60 mL/min (ref >60)
GFR calc non Af Amer: >60 mL/min (ref >60)
Glucose, Bld: 117 mg/dL — ABNORMAL HIGH (ref 70–99)
Potassium: 4.3 mmol/L (ref 3.5–5.1)
Sodium: 138 mmol/L (ref 135–145)
Total Bilirubin: 0.5 mg/dL (ref 0.3–1.2)
Total Protein: 7.6 g/dL (ref 6.5–8.1)

## 2018-12-15 LAB — CBC
HCT: 41 % (ref 36.0–46.0)
Hemoglobin: 13 g/dL (ref 12.0–15.0)
MCH: 29.2 pg (ref 26.0–34.0)
MCHC: 31.7 g/dL (ref 30.0–36.0)
MCV: 92.1 fL (ref 80.0–100.0)
Platelets: 388 10*3/uL (ref 150–400)
RBC: 4.45 MIL/uL (ref 3.87–5.11)
RDW: 12.3 % (ref 11.5–15.5)
WBC: 10.5 10*3/uL (ref 4.0–10.5)
nRBC: 0 % (ref 0.0–0.2)

## 2018-12-15 LAB — SURGICAL PCR SCREEN
MRSA, PCR: NEGATIVE
Staphylococcus aureus: NEGATIVE

## 2018-12-15 LAB — TYPE AND SCREEN
ABO/RH(D): O POS
Antibody Screen: NEGATIVE

## 2018-12-15 LAB — PROTIME-INR
INR: 1.1 (ref 0.8–1.2)
Prothrombin Time: 14.2 seconds (ref 11.4–15.2)

## 2018-12-15 LAB — ABO/RH: ABO/RH(D): O POS

## 2018-12-15 LAB — APTT: aPTT: 32 seconds (ref 24–36)

## 2018-12-15 NOTE — Progress Notes (Signed)
PCP - Hollice Gong, NP Cardiologist - denies  Chest x-ray - 12/15/2018 EKG - 12/15/2018 Stress Test - denies ECHO - denies Cardiac Cath - denies  Sleep Study - denies CPAP - N/A  Blood Thinner Instructions: N/A Aspirin Instructions: N/A  Anesthesia review: NO  Coronavirus Screening  Have you experienced the following symptoms:  Cough yes/no: No Fever (>100.70F)  yes/no: No Runny nose yes/no: No Sore throat yes/no: No Difficulty breathing/shortness of breath  yes/no: No  Have you or a family member traveled in the last 14 days and where? yes/no: No  If the patient indicates "YES" to the above questions, their PAT will be rescheduled to limit the exposure to others and, the surgeon will be notified. THE PATIENT WILL NEED TO BE ASYMPTOMATIC FOR 14 DAYS.   If the patient is not experiencing any of these symptoms, the PAT nurse will instruct them to NOT bring anyone with them to their appointment since they may have these symptoms or traveled as well.   Please remind your patients and families that hospital visitation restrictions are in effect and the importance of the restrictions.    Patient denies shortness of breath, fever, cough and chest pain at PAT appointment  Patient verbalized understanding of instructions that were given to them at the PAT appointment. Patient was also instructed that they will need to review over the PAT instructions again at home before surgery.

## 2018-12-15 NOTE — Progress Notes (Unsigned)
Referral sent to [facility].  Records faxed on 5/20

## 2018-12-16 ENCOUNTER — Inpatient Hospital Stay (HOSPITAL_COMMUNITY)
Admission: EM | Admit: 2018-12-16 | Discharge: 2018-12-24 | DRG: 988 | Disposition: A | Discharge status: Home / Self Care | Payer: BC Managed Care – PPO | Attending: Internal Medicine | Admitting: Internal Medicine

## 2018-12-16 ENCOUNTER — Other Ambulatory Visit: Payer: Self-pay

## 2018-12-16 ENCOUNTER — Emergency Department (HOSPITAL_COMMUNITY): Payer: BC Managed Care – PPO

## 2018-12-16 ENCOUNTER — Encounter (HOSPITAL_COMMUNITY): Payer: Self-pay | Admitting: Emergency Medicine

## 2018-12-16 ENCOUNTER — Other Ambulatory Visit (HOSPITAL_COMMUNITY): Payer: Self-pay | Admitting: *Deleted

## 2018-12-16 ENCOUNTER — Encounter (HOSPITAL_COMMUNITY): Payer: Self-pay | Admitting: *Deleted

## 2018-12-16 DIAGNOSIS — D6869 Other thrombophilia: Secondary | ICD-10-CM | POA: Diagnosis present

## 2018-12-16 DIAGNOSIS — D638 Anemia in other chronic diseases classified elsewhere: Secondary | ICD-10-CM | POA: Diagnosis present

## 2018-12-16 DIAGNOSIS — F17210 Nicotine dependence, cigarettes, uncomplicated: Secondary | ICD-10-CM | POA: Diagnosis present

## 2018-12-16 DIAGNOSIS — N289 Disorder of kidney and ureter, unspecified: Secondary | ICD-10-CM

## 2018-12-16 DIAGNOSIS — R52 Pain, unspecified: Secondary | ICD-10-CM

## 2018-12-16 DIAGNOSIS — Z885 Allergy status to narcotic agent status: Secondary | ICD-10-CM

## 2018-12-16 DIAGNOSIS — R0600 Dyspnea, unspecified: Secondary | ICD-10-CM

## 2018-12-16 DIAGNOSIS — Z79890 Hormone replacement therapy (postmenopausal): Secondary | ICD-10-CM

## 2018-12-16 DIAGNOSIS — Z9889 Other specified postprocedural states: Secondary | ICD-10-CM

## 2018-12-16 DIAGNOSIS — G893 Neoplasm related pain (acute) (chronic): Secondary | ICD-10-CM | POA: Diagnosis present

## 2018-12-16 DIAGNOSIS — R918 Other nonspecific abnormal finding of lung field: Secondary | ICD-10-CM | POA: Diagnosis not present

## 2018-12-16 DIAGNOSIS — Z809 Family history of malignant neoplasm, unspecified: Secondary | ICD-10-CM

## 2018-12-16 DIAGNOSIS — F419 Anxiety disorder, unspecified: Secondary | ICD-10-CM | POA: Diagnosis present

## 2018-12-16 DIAGNOSIS — Z886 Allergy status to analgesic agent status: Secondary | ICD-10-CM

## 2018-12-16 DIAGNOSIS — N280 Ischemia and infarction of kidney: Principal | ICD-10-CM | POA: Diagnosis present

## 2018-12-16 DIAGNOSIS — E890 Postprocedural hypothyroidism: Secondary | ICD-10-CM | POA: Diagnosis present

## 2018-12-16 DIAGNOSIS — C786 Secondary malignant neoplasm of retroperitoneum and peritoneum: Secondary | ICD-10-CM | POA: Diagnosis present

## 2018-12-16 DIAGNOSIS — N179 Acute kidney failure, unspecified: Secondary | ICD-10-CM | POA: Diagnosis not present

## 2018-12-16 DIAGNOSIS — E039 Hypothyroidism, unspecified: Secondary | ICD-10-CM | POA: Diagnosis present

## 2018-12-16 DIAGNOSIS — Z20828 Contact with and (suspected) exposure to other viral communicable diseases: Secondary | ICD-10-CM | POA: Diagnosis present

## 2018-12-16 DIAGNOSIS — I749 Embolism and thrombosis of unspecified artery: Secondary | ICD-10-CM | POA: Diagnosis present

## 2018-12-16 DIAGNOSIS — C3412 Malignant neoplasm of upper lobe, left bronchus or lung: Secondary | ICD-10-CM | POA: Diagnosis present

## 2018-12-16 DIAGNOSIS — C3490 Malignant neoplasm of unspecified part of unspecified bronchus or lung: Secondary | ICD-10-CM

## 2018-12-16 DIAGNOSIS — C7951 Secondary malignant neoplasm of bone: Secondary | ICD-10-CM | POA: Diagnosis present

## 2018-12-16 LAB — URINALYSIS, ROUTINE W REFLEX MICROSCOPIC
Bilirubin Urine: NEGATIVE
Glucose, UA: NEGATIVE mg/dL
Hgb urine dipstick: NEGATIVE
Ketones, ur: 20 mg/dL — AB
Leukocytes,Ua: NEGATIVE
Nitrite: NEGATIVE
Protein, ur: 100 mg/dL — AB
Specific Gravity, Urine: 1.027 (ref 1.005–1.030)
pH: 5 (ref 5.0–8.0)

## 2018-12-16 LAB — COMPREHENSIVE METABOLIC PANEL
ALT: 21 U/L (ref 0–44)
AST: 27 U/L (ref 15–41)
Albumin: 4.1 g/dL (ref 3.5–5.0)
Alkaline Phosphatase: 76 U/L (ref 38–126)
Anion gap: 12 (ref 5–15)
BUN: 13 mg/dL (ref 8–23)
CO2: 23 mmol/L (ref 22–32)
Calcium: 9.7 mg/dL (ref 8.9–10.3)
Chloride: 101 mmol/L (ref 98–111)
Creatinine, Ser: 1.05 mg/dL — ABNORMAL HIGH (ref 0.44–1.00)
GFR calc Af Amer: >60 mL/min (ref >60)
GFR calc non Af Amer: 57 mL/min — ABNORMAL LOW (ref >60)
Glucose, Bld: 142 mg/dL — ABNORMAL HIGH (ref 70–99)
Potassium: 4.2 mmol/L (ref 3.5–5.1)
Sodium: 136 mmol/L (ref 135–145)
Total Bilirubin: 0.6 mg/dL (ref 0.3–1.2)
Total Protein: 7.5 g/dL (ref 6.5–8.1)

## 2018-12-16 LAB — CBC
HCT: 37.1 % (ref 36.0–46.0)
Hemoglobin: 12.2 g/dL (ref 12.0–15.0)
MCH: 29.3 pg (ref 26.0–34.0)
MCHC: 32.9 g/dL (ref 30.0–36.0)
MCV: 89.2 fL (ref 80.0–100.0)
Platelets: 328 10*3/uL (ref 150–400)
RBC: 4.16 MIL/uL (ref 3.87–5.11)
RDW: 12.3 % (ref 11.5–15.5)
WBC: 16.5 10*3/uL — ABNORMAL HIGH (ref 4.0–10.5)
nRBC: 0 % (ref 0.0–0.2)

## 2018-12-16 LAB — NOVEL CORONAVIRUS, NAA (HOSP ORDER, SEND-OUT TO REF LAB; TAT 18-24 HRS): SARS-CoV-2, NAA: NOT DETECTED

## 2018-12-16 LAB — LIPASE, BLOOD: Lipase: 21 U/L (ref 11–51)

## 2018-12-16 MED ORDER — HYDROMORPHONE HCL 1 MG/ML IJ SOLN
1.0000 mg | Freq: Once | INTRAMUSCULAR | Status: AC
  Administered 2018-12-16: 1 mg via INTRAVENOUS
  Filled 2018-12-16: qty 1

## 2018-12-16 MED ORDER — HEPARIN (PORCINE) 25000 UT/250ML-% IV SOLN
1150.0000 [IU]/h | INTRAVENOUS | Status: DC
  Administered 2018-12-16: 950 [IU]/h via INTRAVENOUS
  Filled 2018-12-16 (×2): qty 250

## 2018-12-16 MED ORDER — ONDANSETRON 4 MG PO TBDP
4.0000 mg | ORAL_TABLET | Freq: Once | ORAL | Status: AC | PRN
  Administered 2018-12-16: 4 mg via ORAL
  Filled 2018-12-16: qty 1

## 2018-12-16 MED ORDER — ONDANSETRON HCL 4 MG/2ML IJ SOLN
4.0000 mg | Freq: Once | INTRAMUSCULAR | Status: AC
  Administered 2018-12-16: 4 mg via INTRAVENOUS
  Filled 2018-12-16: qty 2

## 2018-12-16 MED ORDER — SODIUM CHLORIDE 0.9% FLUSH
3.0000 mL | Freq: Once | INTRAVENOUS | Status: AC
  Administered 2018-12-16: 22:00:00 3 mL via INTRAVENOUS

## 2018-12-16 MED ORDER — IOHEXOL 300 MG/ML  SOLN
100.0000 mL | Freq: Once | INTRAMUSCULAR | Status: AC | PRN
  Administered 2018-12-16: 100 mL via INTRAVENOUS

## 2018-12-16 MED ORDER — HEPARIN BOLUS VIA INFUSION
3500.0000 [IU] | Freq: Once | INTRAVENOUS | Status: AC
  Administered 2018-12-17: 3500 [IU] via INTRAVENOUS
  Filled 2018-12-16: qty 3500

## 2018-12-16 NOTE — ED Provider Notes (Signed)
[facility]ER[facility] DEPARTMENT Provider Note   CSN: [id number] Arrival date & time: 12/16/18  1919    History   Chief Complaint Chief Complaint  Patient presents with  . Abdominal Pain    HPI Yolanda Miller is a 63 y.o. female.     Patient presents to the emergency department with a chief complaint of left lower abdominal pain.  She states pain began suddenly this morning.  She rates pain as severe.  She reports having history of tumor removal from her abdomen, and also reports that she has a mass in her abdomen and a nodule on her lung, which she is scheduled to have biopsy tomorrow.  She denies any fevers or chills.  She states that she has had some dry cough.  Reports associated nausea with vomiting, but denies any diarrhea.  She states that she is fairly irregular with regard to her bowel movements secondary to opiate use.  She denies any dysuria or hematuria.  The history is provided by the patient. No language interpreter was used.    Past Medical History:  Diagnosis Date  . Anxiety   . Complication of anesthesia   . Depression   . Dyspnea   . History of hiatal hernia    per patient told years ago   . Migraines   . Pneumonia   . Thyroid disease     Patient Active Problem List   Diagnosis Date Noted  . Mass of upper lobe of left lung 11/26/2018  . Close Exposure to Covid-19 Virus 10/17/2018  . Pneumonia 10/15/2018  . HYPOTHYROIDISM 03/24/2007  . HYPERTENSION 03/24/2007    Past Surgical History:  Procedure Laterality Date  . ABDOMINAL HYSTERECTOMY    . CESAREAN SECTION    . LASIK Bilateral   . LEG SURGERY Left 2004  . THYROID CYST EXCISION    . THYROIDECTOMY     parathyroid as well  . TONSILLECTOMY       OB History   No obstetric history on file.      Home Medications    Prior to Admission medications   Medication Sig Start Date End Date Taking? Authorizing Provider  albuterol (PROVENTIL HFA;VENTOLIN HFA) 108 (90 Base)  MCG/ACT inhaler Inhale 1-2 puffs into the lungs every 6 (six) hours as needed for wheezing or shortness of breath.    [provider]  cholecalciferol (VITAMIN D) 1000 units tablet Take 1,000 Units by mouth daily.    [provider]  doxylamine, Sleep, (UNISOM) 25 MG tablet Take 25 mg by mouth at bedtime as needed for sleep.    [provider]  HYDROmorphone (DILAUDID) 2 MG tablet Take 1 tablet (2 mg total) by mouth every 4 (four) hours as needed for severe pain. 12/14/18   Derek Jack, MD  hydrOXYzine (ATARAX/VISTARIL) 25 MG tablet Take 25 mg by mouth every 8 (eight) hours as needed for anxiety. 11/26/18   [provider]  ibuprofen (ADVIL) 200 MG tablet Take 400 mg by mouth every 6 (six) hours as needed for headache or moderate pain.    [provider]  levothyroxine (SYNTHROID, LEVOTHROID) 100 MCG tablet Take 100 mcg by mouth daily before breakfast.     [provider]  meloxicam (MOBIC) 7.5 MG tablet Take 1 tablet by mouth 2 (two) times daily as needed for pain.  11/01/18   [provider]    Family History Family History  Problem Relation Age of Onset  . Cancer Mother   .  Cancer Father   . Cancer Brother     Social History Social History   Tobacco Use  . Smoking status: Former Smoker    Packs/day: 1.00    Years: 45.00    Pack years: 45.00    Last attempt to quit: 10/15/2018    Years since quitting: 0.1  . Smokeless tobacco: Never Used  Substance Use Topics  . Alcohol use: Not Currently    Comment: occasionally  . Drug use: Not on file     Allergies   Demerol [meperidine]; Oxycodone-acetaminophen; and Propoxyphene n-acetaminophen   Review of Systems Review of Systems  All other systems reviewed and are negative.    Physical Exam Updated Vital Signs BP (!) 155/81   Pulse 81   Temp 99.3 F (37.4 C) (Oral)   Resp 20   Ht 5\' 4"  (1.626 m)   Wt 61.2 kg   SpO2 98%   BMI 23.16 kg/m   Physical Exam  Vitals signs and nursing note reviewed.  Constitutional:      General: She is not in acute distress.    Appearance: She is well-developed.  HENT:     Head: Normocephalic and atraumatic.  Eyes:     Conjunctiva/sclera: Conjunctivae normal.  Neck:     Musculoskeletal: Neck supple.  Cardiovascular:     Rate and Rhythm: Normal rate and regular rhythm.     Heart sounds: No murmur.  Pulmonary:     Effort: Pulmonary effort is normal. No respiratory distress.     Breath sounds: Normal breath sounds.  Abdominal:     Palpations: Abdomen is soft.     Tenderness: There is abdominal tenderness.     Comments: LLQ tenderness  Skin:    General: Skin is warm and dry.  Neurological:     General: No focal deficit present.     Mental Status: She is alert and oriented to person, place, and time.  Psychiatric:        Mood and Affect: Mood normal.        Behavior: Behavior normal.      ED Treatments / Results  Labs (all labs ordered are listed, but only abnormal results are displayed) Labs Reviewed  COMPREHENSIVE METABOLIC PANEL - Abnormal; Notable for the following components:      Result Value   Glucose, Bld 142 (*)    Creatinine, Ser 1.05 (*)    GFR calc non Af Amer 57 (*)    All other components within normal limits  CBC - Abnormal; Notable for the following components:   WBC 16.5 (*)    All other components within normal limits  URINALYSIS, ROUTINE W REFLEX MICROSCOPIC - Abnormal; Notable for the following components:   APPearance HAZY (*)    Ketones, ur 20 (*)    Protein, ur 100 (*)    Bacteria, UA RARE (*)    All other components within normal limits  LIPASE, BLOOD    EKG None  Radiology Dg Chest 2 View  Result Date: 12/15/2018 CLINICAL DATA:  Video bronchoscopy. EXAM: CHEST - 2 VIEW COMPARISON:  CT chest dated 11/24/2018. Chest x-ray dated 11/04/2018. FINDINGS: Again noted is a left upper lobe mass. This mass is better appreciated on prior CT and PET-CT. The mass appears  grossly stable in size from prior chest x-ray. There is no pneumothorax. No large pleural effusion. The right lung field is essentially clear. There is no acute osseous abnormality. IMPRESSION: Stable appearance of the chest with a persistent left upper  lobe mass, better evaluated on prior PET-CT and diagnostic contrast-enhanced CT. Electronically Signed   By: Constance Holster M.D.   On: 12/15/2018 14:31    Procedures Procedures (including critical care time) CRITICAL CARE Performed by: Montine Circle  Renal infarct, renal artery thrombus, heparin infusion, multiple consults Total critical care time: 46 minutes  Critical care time was exclusive of separately billable procedures and treating other patients.  Critical care was necessary to treat or prevent imminent or life-threatening deterioration.  Critical care was time spent personally by me on the following activities: development of treatment plan with patient and/or surrogate as well as nursing, discussions with consultants, evaluation of patient's response to treatment, examination of patient, obtaining history from patient or surrogate, ordering and performing treatments and interventions, ordering and review of laboratory studies, ordering and review of radiographic studies, pulse oximetry and re-evaluation of patient's condition.  Medications Ordered in ED Medications  HYDROmorphone (DILAUDID) injection 1 mg (has no administration in time range)  ondansetron (ZOFRAN) injection 4 mg (has no administration in time range)  sodium chloride flush (NS) 0.9 % injection 3 mL (3 mLs Intravenous Given 12/16/18 2206)  ondansetron (ZOFRAN-ODT) disintegrating tablet 4 mg (4 mg Oral Given 12/16/18 1934)     Initial Impression / Assessment and Plan / ED Course  I have reviewed the triage vital signs and the nursing notes.  Pertinent labs & imaging results that were available during my care of the patient were reviewed by me and considered in  my medical decision making (see chart for details).        Patient with severe, sudden onset, left lower quadrant abdominal pain.  History of prior abdominal surgeries, also has known abdominal mass.  Will check CT for further evaluation.  Question constipation secondary to opiate use, but given high white count, and severe onset, feel that CT imaging is indicated to rule out emergent process.  CT shows findings consistent with bilateral renal infarcts, with thrombus in the distal left renal artery.  Left lower quadrant nodule measuring 9 mm, consistent with metastatic disease.  There is also a large metastatic lesion involving the right iliac bone.  Discussed the renal infarct with vascular surgery, Dr. Carlis Abbott, who is appreciated for his help over the phone.  Recommends admission and heparinization, no surgical intervention at this time.  Appreciate Dr. Myna Hidalgo for admitting the patient.  Final Clinical Impressions(s) / ED Diagnoses   Final diagnoses:  Renal infarction [facility])    ED Discharge Orders    None       Montine Circle, PA-C 12/17/18 0025    Tegeler, Gwenyth Allegra, MD 12/17/18 [phone]

## 2018-12-16 NOTE — Progress Notes (Signed)
Patient called clinic this morning stating that her pain medication (Dilaudid) was making her nauseous every time she takes it. She is requesting that she be able to take her old medication (hydrocodone) and just take 2 tablets every 6 hours.    I spoke with Dr. Delton Coombes and he gave the order for hydrocodone 5/325 take 2 tablets every 6 hours as needed for pain.    When I returned the call to the patient she was moaning in pain stating that she has a new onset of pain in her left side that feels like it is stabbing.  She reports not taking any medication lately for pain.  I advised her to take her pain medication and if she needs to she can take something for nausea with it to help calm down the nausea. If the pain didn't get better or if it worsened she is to go to the ER for evaluation. She verbalizes understanding.

## 2018-12-16 NOTE — ED Triage Notes (Addendum)
Reports LLQ abdominal pain that radiates into the back that started this morning.  Also endorses nausea and vomiting.  Reports unsure of last bm due to taking lots of pain meds. Reports taking vicodin PTA.

## 2018-12-16 NOTE — ED Notes (Signed)
Have pt call family when up for discahrged

## 2018-12-16 NOTE — Progress Notes (Signed)
ANTICOAGULATION CONSULT NOTE - Initial Consult  Pharmacy Consult for heparin Indication: Renal infarct/thrombus  Allergies  Allergen Reactions  . Demerol [Meperidine] Itching  . Oxycodone-Acetaminophen Itching and Rash  . Propoxyphene N-Acetaminophen Itching and Rash    Patient Measurements: Height: 5\' 4"  (162.6 cm) Weight: 134 lb 14.7 oz (61.2 kg) IBW/kg (Calculated) : 54.7 Heparin Dosing Weight: TBW  Vital Signs: Temp: 99.3 F (37.4 C) (05/21 1940) Temp Source: Oral (05/21 1940) BP: 120/89 (05/21 2215) Pulse Rate: 96 (05/21 2315)  Labs: Recent Labs    12/15/18 0942 12/16/18 1935  HGB 13.0 12.2  HCT 41.0 37.1  PLT 388 328  APTT 32  --   LABPROT 14.2  --   INR 1.1  --   CREATININE 0.74 1.05*    Estimated Creatinine Clearance: 48.6 mL/min (A) (by C-G formula based on SCr of 1.05 mg/dL (H)).   Medical History: Past Medical History:  Diagnosis Date  . Anxiety   . Complication of anesthesia   . Depression   . Dyspnea   . History of hiatal hernia    per patient told years ago   . Migraines   . Pneumonia   . Thyroid disease    Assessment: 59 YOF presenting with abdominal/back pain with N/V found to have occlusive thrombus in left renal artery and pharmacy consulted to start heparin.  No anticoagulation PTA, CBC wnl.    Goal of Therapy:  Heparin level 0.3-0.7 units/ml Monitor platelets by anticoagulation protocol: Yes   Plan:  Give heparin 3500 units IV x1, and gtt at 950 units/hr F/u 6 hour heparin level  Bertis Ruddy, PharmD Clinical Pharmacist Please check AMION for all [facility] numbers 12/16/2018 11:33 PM

## 2018-12-17 ENCOUNTER — Ambulatory Visit (HOSPITAL_COMMUNITY)
Admission: RE | Admit: 2018-12-17 | Payer: BC Managed Care – PPO | Source: Home / Self Care | Admitting: Thoracic Surgery (Cardiothoracic Vascular Surgery)

## 2018-12-17 ENCOUNTER — Encounter (HOSPITAL_COMMUNITY)
Admission: EM | Disposition: A | Discharge status: Home / Self Care | Payer: Self-pay | Source: Home / Self Care | Attending: Internal Medicine

## 2018-12-17 ENCOUNTER — Inpatient Hospital Stay (HOSPITAL_COMMUNITY): Payer: BC Managed Care – PPO

## 2018-12-17 ENCOUNTER — Encounter (HOSPITAL_COMMUNITY): Payer: Self-pay | Admitting: Family Medicine

## 2018-12-17 DIAGNOSIS — Z885 Allergy status to narcotic agent status: Secondary | ICD-10-CM | POA: Diagnosis not present

## 2018-12-17 DIAGNOSIS — R1032 Left lower quadrant pain: Secondary | ICD-10-CM | POA: Diagnosis present

## 2018-12-17 DIAGNOSIS — Z809 Family history of malignant neoplasm, unspecified: Secondary | ICD-10-CM | POA: Diagnosis not present

## 2018-12-17 DIAGNOSIS — G893 Neoplasm related pain (acute) (chronic): Secondary | ICD-10-CM | POA: Diagnosis present

## 2018-12-17 DIAGNOSIS — E890 Postprocedural hypothyroidism: Secondary | ICD-10-CM | POA: Diagnosis present

## 2018-12-17 DIAGNOSIS — D638 Anemia in other chronic diseases classified elsewhere: Secondary | ICD-10-CM | POA: Diagnosis present

## 2018-12-17 DIAGNOSIS — Z79890 Hormone replacement therapy (postmenopausal): Secondary | ICD-10-CM | POA: Diagnosis not present

## 2018-12-17 DIAGNOSIS — Z886 Allergy status to analgesic agent status: Secondary | ICD-10-CM | POA: Diagnosis not present

## 2018-12-17 DIAGNOSIS — N289 Disorder of kidney and ureter, unspecified: Secondary | ICD-10-CM

## 2018-12-17 DIAGNOSIS — N280 Ischemia and infarction of kidney: Secondary | ICD-10-CM | POA: Diagnosis present

## 2018-12-17 DIAGNOSIS — N179 Acute kidney failure, unspecified: Secondary | ICD-10-CM | POA: Diagnosis not present

## 2018-12-17 DIAGNOSIS — I749 Embolism and thrombosis of unspecified artery: Secondary | ICD-10-CM | POA: Diagnosis present

## 2018-12-17 DIAGNOSIS — E039 Hypothyroidism, unspecified: Secondary | ICD-10-CM | POA: Diagnosis not present

## 2018-12-17 DIAGNOSIS — C7951 Secondary malignant neoplasm of bone: Secondary | ICD-10-CM | POA: Diagnosis present

## 2018-12-17 DIAGNOSIS — C3412 Malignant neoplasm of upper lobe, left bronchus or lung: Secondary | ICD-10-CM | POA: Diagnosis present

## 2018-12-17 DIAGNOSIS — C786 Secondary malignant neoplasm of retroperitoneum and peritoneum: Secondary | ICD-10-CM | POA: Diagnosis present

## 2018-12-17 DIAGNOSIS — Z20828 Contact with and (suspected) exposure to other viral communicable diseases: Secondary | ICD-10-CM | POA: Diagnosis present

## 2018-12-17 DIAGNOSIS — F419 Anxiety disorder, unspecified: Secondary | ICD-10-CM | POA: Diagnosis present

## 2018-12-17 DIAGNOSIS — F17210 Nicotine dependence, cigarettes, uncomplicated: Secondary | ICD-10-CM | POA: Diagnosis present

## 2018-12-17 DIAGNOSIS — R918 Other nonspecific abnormal finding of lung field: Secondary | ICD-10-CM | POA: Diagnosis not present

## 2018-12-17 DIAGNOSIS — D6869 Other thrombophilia: Secondary | ICD-10-CM | POA: Diagnosis present

## 2018-12-17 LAB — BASIC METABOLIC PANEL
Anion gap: 14 (ref 5–15)
BUN: 14 mg/dL (ref 8–23)
CO2: 25 mmol/L (ref 22–32)
Calcium: 9.3 mg/dL (ref 8.9–10.3)
Chloride: 99 mmol/L (ref 98–111)
Creatinine, Ser: 1.09 mg/dL — ABNORMAL HIGH (ref 0.44–1.00)
GFR calc Af Amer: >60 mL/min (ref >60)
GFR calc non Af Amer: 55 mL/min — ABNORMAL LOW (ref >60)
Glucose, Bld: 122 mg/dL — ABNORMAL HIGH (ref 70–99)
Potassium: 5 mmol/L (ref 3.5–5.1)
Sodium: 138 mmol/L (ref 135–145)

## 2018-12-17 LAB — HEPARIN LEVEL (UNFRACTIONATED)
Heparin Unfractionated: 0.31 IU/mL (ref 0.30–0.70)
Heparin Unfractionated: 0.18 IU/mL — ABNORMAL LOW (ref 0.30–0.70)
Heparin Unfractionated: 0.29 IU/mL — ABNORMAL LOW (ref 0.30–0.70)

## 2018-12-17 LAB — CBC
HCT: 36.4 % (ref 36.0–46.0)
Hemoglobin: 12 g/dL (ref 12.0–15.0)
MCH: 29.3 pg (ref 26.0–34.0)
MCHC: 33 g/dL (ref 30.0–36.0)
MCV: 88.8 fL (ref 80.0–100.0)
Platelets: 293 10*3/uL (ref 150–400)
RBC: 4.1 MIL/uL (ref 3.87–5.11)
RDW: 12.2 % (ref 11.5–15.5)
WBC: 19.6 10*3/uL — ABNORMAL HIGH (ref 4.0–10.5)
nRBC: 0 % (ref 0.0–0.2)

## 2018-12-17 LAB — GLUCOSE, CAPILLARY: Glucose-Capillary: 107 mg/dL — ABNORMAL HIGH (ref 70–99)

## 2018-12-17 LAB — ECHOCARDIOGRAM COMPLETE
Height: 64 in
Weight: 2158.74 oz

## 2018-12-17 SURGERY — BRONCHOSCOPY, WITH EBUS
Anesthesia: General

## 2018-12-17 MED ORDER — ONDANSETRON HCL 4 MG/2ML IJ SOLN: 4.0000 mg | Freq: Four times a day (QID) | INTRAMUSCULAR | Status: DC | PRN

## 2018-12-17 MED ORDER — SODIUM CHLORIDE 0.9 % IV SOLN
INTRAVENOUS | Status: DC
  Administered 2018-12-17: 03:00:00 via INTRAVENOUS

## 2018-12-17 MED ORDER — HEPARIN BOLUS VIA INFUSION
1800.0000 [IU] | Freq: Once | INTRAVENOUS | Status: AC
  Administered 2018-12-17: 1800 [IU] via INTRAVENOUS
  Filled 2018-12-17: qty 1800

## 2018-12-17 MED ORDER — BOOST / RESOURCE BREEZE PO LIQD CUSTOM
1.0000 | Freq: Three times a day (TID) | ORAL | Status: DC
  Administered 2018-12-17 – 2018-12-24 (×14): 1 via ORAL
  Filled 2018-12-17 (×23): qty 1

## 2018-12-17 MED ORDER — SODIUM CHLORIDE 0.9% FLUSH
3.0000 mL | Freq: Two times a day (BID) | INTRAVENOUS | Status: DC
  Administered 2018-12-18 – 2018-12-19 (×2): 3 mL via INTRAVENOUS
  Administered 2018-12-21: 10 mL via INTRAVENOUS
  Administered 2018-12-22 – 2018-12-23 (×4): 3 mL via INTRAVENOUS

## 2018-12-17 MED ORDER — HYDROCODONE-ACETAMINOPHEN 5-325 MG PO TABS
1.0000 | ORAL_TABLET | ORAL | Status: DC | PRN
  Administered 2018-12-17 – 2018-12-24 (×17): 1 via ORAL
  Filled 2018-12-17 (×18): qty 1

## 2018-12-17 MED ORDER — BISACODYL 5 MG PO TBEC: 5.0000 mg | DELAYED_RELEASE_TABLET | Freq: Every day | ORAL | Status: DC | PRN

## 2018-12-17 MED ORDER — LEVOTHYROXINE SODIUM 100 MCG PO TABS
100.0000 ug | ORAL_TABLET | Freq: Every day | ORAL | Status: DC
  Administered 2018-12-17 – 2018-12-24 (×8): 100 ug via ORAL
  Filled 2018-12-17 (×7): qty 1

## 2018-12-17 MED ORDER — HEPARIN (PORCINE) 25000 UT/250ML-% IV SOLN
1650.0000 [IU]/h | INTRAVENOUS | Status: AC
  Administered 2018-12-17 – 2018-12-18 (×2): 1250 [IU]/h via INTRAVENOUS
  Administered 2018-12-19 – 2018-12-20 (×2): 1500 [IU]/h via INTRAVENOUS
  Administered 2018-12-20: 1650 [IU]/h via INTRAVENOUS
  Filled 2018-12-17 (×4): qty 250

## 2018-12-17 MED ORDER — ALBUTEROL SULFATE HFA 108 (90 BASE) MCG/ACT IN AERS: 1.0000 | INHALATION_SPRAY | Freq: Four times a day (QID) | RESPIRATORY_TRACT | Status: DC | PRN

## 2018-12-17 MED ORDER — ENSURE ENLIVE PO LIQD
237.0000 mL | Freq: Every day | ORAL | Status: DC
  Administered 2018-12-17 – 2018-12-20 (×4): 237 mL via ORAL
  Administered 2018-12-21: 11:00:00 via ORAL
  Administered 2018-12-22 – 2018-12-24 (×3): 237 mL via ORAL
  Filled 2018-12-17: qty 237

## 2018-12-17 MED ORDER — ACETAMINOPHEN 650 MG RE SUPP: 650.0000 mg | Freq: Four times a day (QID) | RECTAL | Status: DC | PRN

## 2018-12-17 MED ORDER — ADULT MULTIVITAMIN W/MINERALS CH
1.0000 | ORAL_TABLET | Freq: Every day | ORAL | Status: DC
  Administered 2018-12-17 – 2018-12-24 (×8): 1 via ORAL
  Filled 2018-12-17 (×8): qty 1

## 2018-12-17 MED ORDER — ACETAMINOPHEN 325 MG PO TABS
650.0000 mg | ORAL_TABLET | Freq: Four times a day (QID) | ORAL | Status: DC | PRN
  Administered 2018-12-17 – 2018-12-18 (×2): 650 mg via ORAL
  Filled 2018-12-17 (×2): qty 2

## 2018-12-17 MED ORDER — ONDANSETRON HCL 4 MG PO TABS: 4.0000 mg | ORAL_TABLET | Freq: Four times a day (QID) | ORAL | Status: DC | PRN

## 2018-12-17 MED ORDER — POLYETHYLENE GLYCOL 3350 17 G PO PACK: 17.0000 g | PACK | Freq: Every day | ORAL | Status: DC | PRN

## 2018-12-17 MED ORDER — HYDROMORPHONE HCL 1 MG/ML IJ SOLN
0.5000 mg | INTRAMUSCULAR | Status: DC | PRN
  Administered 2018-12-17 – 2018-12-23 (×4): 0.5 mg via INTRAVENOUS
  Filled 2018-12-17 (×4): qty 1

## 2018-12-17 MED ORDER — ALBUTEROL SULFATE (2.5 MG/3ML) 0.083% IN NEBU
2.5000 mg | INHALATION_SOLUTION | Freq: Four times a day (QID) | RESPIRATORY_TRACT | Status: DC | PRN
  Filled 2018-12-17: qty 3

## 2018-12-17 NOTE — Progress Notes (Signed)
  Echocardiogram 2D Echocardiogram has been performed.  Jannett Celestine 12/17/2018, 1:32 PM

## 2018-12-17 NOTE — H&P (Signed)
History and Physical    Yolanda Miller YKD:[id number] DOB: 1956/07/08 DOA: 12/16/2018  PCP: [facility]   Patient coming from: Home   Chief Complaint: Flank pain, N/V   HPI: Yolanda Miller is a 63 y.o. female with medical history significant for hypothyroidism and metastatic cancer currently being worked up, and now presenting to the emergency department for evaluation of severe left flank and lower quadrant abdomen pain.  She reports developing severe pain in the left flank and lower abdomen this morning and has had some nausea with a couple episodes of nonbloody vomiting associated with this.  She was initially attributing this to constipation as she had not moved her bowels in a couple days, but the pain was becoming more severe and so she came in for evaluation.  She denies any chest pain or palpitations, denies any pain, swelling, or discoloration of the lower extremities, and denies any recent fevers or chills.  She has a mild nonproductive cough that has not changed much in recent weeks.  Patient has been following with oncology for a lung mass concerning for small cell lung cancer and with metastatic right iliac lesion and peritoneal nodule, was scheduled with CT surgery for bronchoscopy and biopsy on 12/17/2018.  ED Course: Upon arrival to the ED, patient is found to be afebrile, saturating adequately on room air, and with stable blood pressure.  EKG features a sinus rhythm with nonspecific ST-T abnormality.  Chemistry panel is notable for creatinine 1.05, up from 0.74 the day before.  CBC features a leukocytosis to 16,500.  Urinalysis notable for ketonuria and proteinuria.  CT of the abdomen and pelvis reveals diffuse hypoenhancement of the left kidney with distal left renal artery thrombus concerning for diffuse infarction.  Also noted on the CT is some findings suggesting possible right renal infarction, as well as a metastatic peritoneal nodule in the known  right iliac lesion.  Vascular surgery was consulted by the ED physician and while the patient does not require any vascular intervention at this time, medical admission and anticoagulation was recommended and the patient was started on heparin infusion.  Review of Systems:  All other systems reviewed and apart from HPI, are negative.  Past Medical History:  Diagnosis Date   Anxiety    Complication of anesthesia    Depression    Dyspnea    History of hiatal hernia    per patient told years ago    Migraines    Pneumonia    Thyroid disease     Past Surgical History:  Procedure Laterality Date   ABDOMINAL HYSTERECTOMY     CESAREAN SECTION     LASIK Bilateral    LEG SURGERY Left 2004   THYROID CYST EXCISION     THYROIDECTOMY     parathyroid as well   TONSILLECTOMY       reports that she quit smoking about 2 months ago. She has a 45.00 pack-year smoking history. She has never used smokeless tobacco. She reports previous alcohol use. No history on file for drug.  Allergies  Allergen Reactions   Demerol [Meperidine] Itching   Oxycodone-Acetaminophen Itching and Rash   Propoxyphene N-Acetaminophen Itching and Rash    Family History  Problem Relation Age of Onset   Cancer Mother    Cancer Father    Cancer Brother      Prior to Admission medications   Medication Sig Start Date End Date Taking? Authorizing Provider  albuterol (PROVENTIL HFA;VENTOLIN HFA)  108 (90 Base) MCG/ACT inhaler Inhale 1-2 puffs into the lungs every 6 (six) hours as needed for wheezing or shortness of breath.    [provider]  cholecalciferol (VITAMIN D) 1000 units tablet Take 1,000 Units by mouth daily.    [provider]  doxylamine, Sleep, (UNISOM) 25 MG tablet Take 25 mg by mouth at bedtime as needed for sleep.    [provider]  HYDROmorphone (DILAUDID) 2 MG tablet Take 1 tablet (2 mg total) by mouth every 4 (four) hours as needed for severe pain.  12/14/18   Derek Jack, MD  hydrOXYzine (ATARAX/VISTARIL) 25 MG tablet Take 25 mg by mouth every 8 (eight) hours as needed for anxiety. 11/26/18   [provider]  ibuprofen (ADVIL) 200 MG tablet Take 400 mg by mouth every 6 (six) hours as needed for headache or moderate pain.    [provider]  levothyroxine (SYNTHROID, LEVOTHROID) 100 MCG tablet Take 100 mcg by mouth daily before breakfast.     [provider]  meloxicam (MOBIC) 7.5 MG tablet Take 1 tablet by mouth 2 (two) times daily as needed for pain.  11/01/18   [provider]    Physical Exam: Vitals:   12/16/18 2215 12/16/18 2315 12/16/18 2330 12/17/18 0000  BP: 120/89  (!) 151/78   Pulse: 74 96 81 88  Resp: 15 (!) 23 19 18   Temp:      TempSrc:      SpO2: 99% 100% 96% 94%  Weight:      Height:        Constitutional: NAD, calm  Eyes: PERTLA, lids and conjunctivae normal ENMT: Mucous membranes are moist. Posterior pharynx clear of any exudate or lesions.   Neck: normal, supple, no masses, no thyromegaly Respiratory: no wheezing, no crackles. No accessory muscle use.  Cardiovascular: S1 & S2 heard, regular rate and rhythm. No extremity edema.   Abdomen: No distension, soft, tender in left flank, no rebound pain or guarding. Bowel sounds active.  Musculoskeletal: no clubbing / cyanosis. No joint deformity upper and lower extremities.   Skin: no significant rashes, lesions, ulcers. Warm, dry, well-perfused. Neurologic: CN 2-12 grossly intact. Sensation intact. Strength 5/5 in all 4 limbs.  Psychiatric: Alert and oriented x 3. Pleasant, cooperative.    Labs on Admission: I have personally reviewed following labs and imaging studies  CBC: Recent Labs  Lab 12/15/18 0942 12/16/18 1935  WBC 10.5 16.5*  HGB 13.0 12.2  HCT 41.0 37.1  MCV 92.1 89.2  PLT 388 240   Basic Metabolic Panel: Recent Labs  Lab 12/15/18 0942 12/16/18 1935  NA 138 136  K 4.3 4.2  CL 103 101  CO2 23 23    GLUCOSE 117* 142*  BUN 11 13  CREATININE 0.74 1.05*  CALCIUM 9.4 9.7   GFR: Estimated Creatinine Clearance: 48.6 mL/min (A) (by C-G formula based on SCr of 1.05 mg/dL (H)). Liver Function Tests: Recent Labs  Lab 12/15/18 0942 12/16/18 1935  AST 15 27  ALT 14 21  ALKPHOS 72 76  BILITOT 0.5 0.6  PROT 7.6 7.5  ALBUMIN 4.2 4.1   Recent Labs  Lab 12/16/18 1935  LIPASE 21   No results for input(s): AMMONIA in the last 168 hours. Coagulation Profile: Recent Labs  Lab 12/15/18 0942  INR 1.1   Cardiac Enzymes: No results for input(s): CKTOTAL, CKMB, CKMBINDEX, TROPONINI in the last 168 hours. BNP (last 3 results) No results for input(s): PROBNP in the last 8760  hours. HbA1C: No results for input(s): HGBA1C in the last 72 hours. CBG: No results for input(s): GLUCAP in the last 168 hours. Lipid Profile: No results for input(s): CHOL, HDL, LDLCALC, TRIG, CHOLHDL, LDLDIRECT in the last 72 hours. Thyroid Function Tests: No results for input(s): TSH, T4TOTAL, FREET4, T3FREE, THYROIDAB in the last 72 hours. Anemia Panel: No results for input(s): VITAMINB12, FOLATE, FERRITIN, TIBC, IRON, RETICCTPCT in the last 72 hours. Urine analysis:    Component Value Date/Time   COLORURINE YELLOW 12/16/2018 2130   APPEARANCEUR HAZY (A) 12/16/2018 2130   LABSPEC 1.027 12/16/2018 2130   PHURINE 5.0 12/16/2018 2130   GLUCOSEU NEGATIVE 12/16/2018 2130   HGBUR NEGATIVE 12/16/2018 2130   BILIRUBINUR NEGATIVE 12/16/2018 2130   KETONESUR 20 (A) 12/16/2018 2130   PROTEINUR 100 (A) 12/16/2018 2130   UROBILINOGEN 4.0 (H) 12/21/2011 2335   NITRITE NEGATIVE 12/16/2018 2130   LEUKOCYTESUR NEGATIVE 12/16/2018 2130   Sepsis Labs: @LABRCNTIP (procalcitonin:4,lacticidven:4) ) Recent Results (from the past 240 hour(s))  Surgical pcr screen     Status: None   Collection Time: 12/15/18  9:42 AM  Result Value Ref Range Status   MRSA, PCR NEGATIVE NEGATIVE Final   Staphylococcus aureus NEGATIVE  NEGATIVE Final    Comment: (NOTE) The Xpert SA Assay (FDA approved for NASAL specimens in patients 12 years of age and older), is one component of a comprehensive surveillance program. It is not intended to diagnose infection nor to guide or monitor treatment. Performed at [facility], [place] [address], [place], [place] [address]   Novel Coronavirus, NAA (hospital order; send-out to ref lab)     Status: None   Collection Time: 12/15/18 10:13 AM  Result Value Ref Range Status   SARS-CoV-2, NAA NOT DETECTED NOT DETECTED Final    Comment: (NOTE) Testing was performed using the cobas(R) SARS-CoV-2 test. This test was developed and its performance characteristics determined by [employer]. This test has not been FDA cleared or approved. This test has been authorized by FDA under an Emergency Use Authorization (EUA). This test is only authorized for the duration of time the declaration that circumstances exist justifying the authorization of the emergency use of in vitro diagnostic tests for detection of SARS-CoV-2 virus and/or diagnosis of COVID-19 infection under section 564(b)(1) of the Act, 21 U.S.C. 604VWU-9(W)(1), unless the authorization is terminated or revoked sooner. When diagnostic testing is negative, the possibility of a false negative result should be considered in the context of a patient's recent exposures and the presence of clinical signs and symptoms consistent with COVID-19. An individual without symptoms of COVID-19 and who is not shedding SARS-CoV-2 virus would expect to have  a negative (not detected) result in this assay. Performed At: [facility] [address] [place], [place] [id number] Rush Farmer MD AO:[id number]    [place]  Final    Comment: Performed at [facility], [place] [address], [place], [place] [address]     Radiological Exams on Admission: Dg Chest 2 View  Result Date:  12/15/2018 CLINICAL DATA:  Video bronchoscopy. EXAM: CHEST - 2 VIEW COMPARISON:  CT chest dated 11/24/2018. Chest x-ray dated 11/04/2018. FINDINGS: Again noted is a left upper lobe mass. This mass is better appreciated on prior CT and PET-CT. The mass appears grossly stable in size from prior chest x-ray. There is no pneumothorax. No large pleural effusion. The right lung field is essentially clear. There is no acute osseous abnormality. IMPRESSION: Stable appearance of the chest  with a persistent left upper lobe mass, better evaluated on prior PET-CT and diagnostic contrast-enhanced CT. Electronically Signed   By: Constance Holster M.D.   On: 12/15/2018 14:31   Ct Abdomen Pelvis W Contrast  Result Date: 12/16/2018 CLINICAL DATA:  Left lower quadrant pain with nausea and vomiting EXAM: CT ABDOMEN AND PELVIS WITH CONTRAST TECHNIQUE: Multidetector CT imaging of the abdomen and pelvis was performed using the standard protocol following bolus administration of intravenous contrast. CONTRAST:  146mL OMNIPAQUE IOHEXOL 300 MG/ML  SOLN COMPARISON:  PET CT 12/08/2018 FINDINGS: Lower chest: Lung bases demonstrate no acute consolidation or effusion. The heart size is normal. Partially visualized small pericardial effusion measuring up to 9 mm in thickness. Hepatobiliary: No focal liver abnormality is seen. No gallstones, gallbladder wall thickening, or biliary dilatation. Pancreas: Unremarkable. No pancreatic ductal dilatation or surrounding inflammatory changes. Spleen: Normal in size without focal abnormality. Adrenals/Urinary Tract: Right adrenal gland is normal. Slightly nodular left adrenal gland without well-defined mass. No hydronephrosis. Wedge-shaped hypodensity within the mid right kidney and lower pole of the right kidney, suspect for infarcts. Diffuse hypoenhancement of the left kidney with minimal foci of cortical enhancement, consistent with infarcted left kidney. Occlusive thrombus within the distal left  renal artery. Renal vein appears to enhance normally. The bladder is unremarkable Stomach/Bowel: Stomach is within normal limits. Appendix appears normal. No evidence of bowel wall thickening, distention, or inflammatory changes. Vascular/Lymphatic: Nonaneurysmal aorta. Moderate aortic atherosclerosis. Subcentimeter retroperitoneal lymph nodes Reproductive: Status post hysterectomy. No adnexal masses. Other: No free air. Thickening in the left gutter with 9 mm intraperitoneal nodule. Musculoskeletal: Large metastatic lesion involving the right iliac bone. Soft tissue component extends into the right gluteal musculature. IMPRESSION: 1. Diffuse hypoenhancement of the left kidney with small foci of cortical sparing, findings are consistent with diffuse infarction of the left kidney. There is occlusive thrombus present within the distal left renal artery. Right kidney demonstrates small foci of cortical wedge-shaped hypodensity, also suspect for renal infarcts. 2. Left lower quadrant 9 mm peritoneal nodule consistent with metastatic disease 3. Large metastatic lesion involving the right iliac bone. 4. Small pericardial effusion Electronically Signed   By: Donavan Foil M.D.   On: 12/16/2018 23:09    EKG: Independently reviewed. Sinus rhythm, non-specific ST-T abnormality.   Assessment/Plan   1. Renal artery thrombosis with infarction  - Presents with one day of pain in left flank and LLQ, and is found to have diffuse infarction of left kidney, left renal artery thrombosis, and likely right renal infarctions  - Renal function slightly impaired  - She is in sinus rhythm in ED, denies palpitations, and no evidence for other sites of thrombosis identified  - Etiology could be hypercoagulability of malignancy, though cardiac or other source also considered  - She has been started on IV heparin infusion in ED  - Continue anticoagulation, continue cardiac monitoring, check echocardiogram    2. Metastatic cancer    - Following with oncology for left lung mass with right iliac and peritoneal mets  - She had been scheduled for bronchoscopy with biopsy 12/17/18    3. Mild renal insufficiency  - SCr is 1.04 on admission, up from 0.74 the day before  - Secondary to bilateral renal infarction vs prerenal azotemia in setting of N/V  - Avoid nephrotoxins, hypotension, and dehydration - Repeat chem panel in am    4. Hypothyroidism  - Continue Synthroid     PPE: Mask, face shield  DVT prophylaxis: IV heparin infusion  Code Status: Full  Family Communication: Discussed with patient  Consults called: None  Admission status: Observation     Vianne Bulls, MD [facility] Pager [phone]  If 7PM-7AM, please contact night-coverage [email or web address] Password [facility]  12/17/2018, 12:11 AM

## 2018-12-17 NOTE — Progress Notes (Signed)
ANTICOAGULATION CONSULT NOTE - Follow-Up Consult  Pharmacy Consult for Heparin Indication: Renal infarct/thrombus  Patient Measurements: Height: 5\' 4"  (162.6 cm) Weight: 134 lb 14.7 oz (61.2 kg) IBW/kg (Calculated) : 54.7 Heparin Dosing Weight: 61.2 kg  Vital Signs: Temp: 98.6 F (37 C) (05/22 0851) Temp Source: Oral (05/22 0851) BP: 123/75 (05/22 0851) Pulse Rate: 102 (05/22 0851)  Labs: Recent Labs    12/15/18 0942 12/16/18 1935 12/17/18 0605 12/17/18 1256  HGB 13.0 12.2 12.0  --   HCT 41.0 37.1 36.4  --   PLT 388 328 293  --   APTT 32  --   --   --   LABPROT 14.2  --   --   --   INR 1.1  --   --   --   HEPARINUNFRC  --   --  0.31 0.18*  CREATININE 0.74 1.05* 1.09*  --     Estimated Creatinine Clearance: 46.8 mL/min (A) (by C-G formula based on SCr of 1.09 mg/dL (H)).   Assessment: Yolanda Miller presenting with abdominal/back pain with N/V found to have occlusive thrombus in left renal artery and pharmacy consulted to start heparin.  Heparin level this morning was therapeutic (HL 0.31, goal of 0.3-0.7) on heparin rate 950 units/hr CBC wnl.  This afternoon the 2nd heparin level has dropped to 0.18 on same rate 950 units/hr.  No issues or interruptions with IV heparin infusion per RN.  No bleeding noted at this time, confirmed with RN.  Goal of Therapy:  Heparin level 0.3-0.7 units/ml Monitor platelets by anticoagulation protocol: Yes   Plan:  - Give heparin bolus 1800 units IV x1 - Increase IV Heparin to 1150  units/hr  - Check heparin level in 6 hours - Will continue to monitor for any signs/symptoms of bleeding  Thank you for allowing pharmacy to be a part of this patient's care.  Nicole Cella, RPh Clinical Pharmacist Pager: [phone] Clinical phone for 12/17/2018: [phone] 12/17/2018 2:59 PM  **Pharmacist phone directory can now be found on [email or web address] (PW [email or web address]).  Listed under [place].

## 2018-12-17 NOTE — Progress Notes (Signed)
Initial Nutrition Assessment  RD working remotely.  DOCUMENTATION CODES:   Not applicable  INTERVENTION:   -Ensure Enlive po daily, each supplement provides 350 kcal and 20 grams of protein -Boost Breeze po TID, each supplement provides 250 kcal and 9 grams of protein -MVI with minerals daily  NUTRITION DIAGNOSIS:   Increased nutrient needs related to cancer and cancer related treatments as evidenced by estimated needs.  GOAL:   Patient will meet greater than or equal to 90% of their needs  MONITOR:   PO intake, Supplement acceptance, Labs, Weight trends, Skin, I & O's  REASON FOR ASSESSMENT:   Malnutrition Screening Tool    ASSESSMENT:   Yolanda Miller is a 63 y.o. female with medical history significant for hypothyroidism and metastatic cancer currently being worked up, and now presenting to the emergency department for evaluation of severe left flank and lower quadrant abdomen pain.  She reports developing severe pain in the left flank and lower abdomen this morning and has had some nausea with a couple episodes of nonbloody vomiting associated with this.  She was initially attributing this to constipation as she had not moved her bowels in a couple days, but the pain was becoming more severe and so she came in for evaluation.  She denies any chest pain or palpitations, denies any pain, swelling, or discoloration of the lower extremities, and denies any recent fevers or chills.  She has a mild nonproductive cough that has not changed much in recent weeks.  Patient has been following with oncology for a lung mass concerning for small cell lung cancer and with metastatic right iliac lesion and peritoneal nodule, was scheduled with CT surgery for bronchoscopy and biopsy on 12/17/2018.  Pt admitted with renal artery thrombosis with infarction.   Reviewed I/O's: +128 ml x 24 hours  UOP: 0 ml x 24 hours  Per CVTS notes, pt with lt lung mass with rt uliac and peritoneal  metastais (followed at [facility]). Pt was originially scheduled for bronchoscopy on 12/17/18, but will no be performed on 12/20/18.   Spoke with pt via telephone, who had a flat affect. Pt reports she is feeling poorly. She endorses a general decline in health over the past month, including progressively poor appetite and weight loss. Pt shares that she sometimes goes days without eating, especially when she is in pain (like she is currently). Over the past several days, intake has consisted mainly of one bottle of Ensure and peanut butter crackers. Pt shares that is often takes her an entire day to drink a bottle of Ensure "because it it so thick". Documented meal intake 0%.   Pt reports UBW of arounf 150-155#- she estimates she has lost 15# in one month. Per documented wt hx, pt has experienced a 6.9% wt loss over the past 3 months, which while not significant for time frame, is concerning given poor oral intake and metastatic lung cancer. Pt is at high risk for malnutrition, however, unable to identify at this time without completion of nutrition-focused physical exam.  Discussed importance of good meal and supplement intake to promote healing. Pt amenable to continue Ensure and try Boost Breeze, as it is not as thick of a texture.   Labs reviewed: CBGS: 107.  NUTRITION - FOCUSED PHYSICAL EXAM:    Most Recent Value  Orbital Region  Unable to assess  Upper Arm Region  Unable to assess  Thoracic and Lumbar Region  Unable to assess  Buccal Region  Unable to  assess  Temple Region  Unable to assess  Clavicle Bone Region  Unable to assess  Clavicle and Acromion Bone Region  Unable to assess  Scapular Bone Region  Unable to assess  Dorsal Hand  Unable to assess  Patellar Region  Unable to assess  Anterior Thigh Region  Unable to assess  Posterior Calf Region  Unable to assess  Edema (RD Assessment)  Unable to assess  Hair  Unable to assess  Eyes  Unable to assess  Mouth  Unable to assess  Skin   Unable to assess  Nails  Unable to assess       Diet Order:   Diet Order            Diet regular Room service appropriate? Yes; Fluid consistency: Thin  Diet effective now              EDUCATION NEEDS:   Education needs have been addressed  Skin:  Skin Assessment: Reviewed RN Assessment  Last BM:  12/15/18  Height:   Ht Readings from Last 1 Encounters:  12/16/18 5\' 4"  (1.626 m)    Weight:   Wt Readings from Last 1 Encounters:  12/16/18 61.2 kg    Ideal Body Weight:  54.5 kg  BMI:  Body mass index is 23.16 kg/m.  Estimated Nutritional Needs:   Kcal:  1700-1900  Protein:  90-105 grams  Fluid:  > 1.7 L    Jamont Mellin A. Jimmye Norman, RD, LDN, [place] Registered Dietitian II Certified Diabetes Care and Education Specialist Pager: [phone] After hours Pager: [phone]

## 2018-12-17 NOTE — TOC Benefit Eligibility Note (Signed)
Transition of Care [facility]) Benefit Eligibility Note    Patient Details  Name: TORRENCE BRANAGAN MRN: [id number] Date of Birth: [birth year]   Medication/Dose: Arne [place]     Covered?: Yes     Prescription Coverage [facility]erred [facility])  Spoke with Person/Company/Phone Number:: SHARON([facility] RX # [phone] OPT- MEMBER)  Co-Pay: Johnsie Kindred  Prior Approval: No     Additional Notes: [place]  TIER-NO  , P/A-NO    Memory Argue Phone Number: 12/17/2018, 4:51 PM

## 2018-12-17 NOTE — Progress Notes (Signed)
ANTICOAGULATION CONSULT NOTE - Follow-Up Consult  Pharmacy Consult for Heparin Indication: Renal infarct/thrombus  Patient Measurements: Height: 5\' 4"  (162.6 cm) Weight: 134 lb 14.7 oz (61.2 kg) IBW/kg (Calculated) : 54.7 Heparin Dosing Weight: 61.2 kg  Vital Signs: Temp: 98.6 F (37 C) (05/22 0851) Temp Source: Oral (05/22 0851) BP: 123/75 (05/22 0851) Pulse Rate: 102 (05/22 0851)  Labs: Recent Labs    12/15/18 0942 12/16/18 1935 12/17/18 0605  HGB 13.0 12.2 12.0  HCT 41.0 37.1 36.4  PLT 388 328 293  APTT 32  --   --   LABPROT 14.2  --   --   INR 1.1  --   --   HEPARINUNFRC  --   --  0.31  CREATININE 0.74 1.05* 1.09*    Estimated Creatinine Clearance: 46.8 mL/min (A) (by C-G formula based on SCr of 1.09 mg/dL (H)).   Assessment: 52 YOF presenting with abdominal/back pain with N/V found to have occlusive thrombus in left renal artery and pharmacy consulted to start heparin.  Heparin level this morning is therapeutic (HL 0.31, goal of 0.3-0.7). CBC wnl, no bleeding noted at this time.   Goal of Therapy:  Heparin level 0.3-0.7 units/ml Monitor platelets by anticoagulation protocol: Yes   Plan:  - Continue Heparin at 950 units/hr (9.5 ml/hr) - Will continue to monitor for any signs/symptoms of bleeding and will follow up with heparin level in 6 hours to confirm therapeutic  Thank you for allowing pharmacy to be a part of this patient's care.  Alycia Rossetti, PharmD, BCPS Clinical Pharmacist Clinical phone for 12/17/2018: [phone] 12/17/2018 11:59 AM   **Pharmacist phone directory can now be found on [email or web address] (PW [email or web address]).  Listed under [place].

## 2018-12-17 NOTE — ED Notes (Signed)
ED TO INPATIENT HANDOFF REPORT  ED Nurse Name and Phone #:  Arlice Colt [phone]  S Name/Age/Gender Armstead Peaks Dorgan 63 y.o. female Room/Bed: 017C/017C  Code Status   Code Status: Full Code  Home/SNF/Other Home Patient oriented to: self, place, time and situation Is this baseline? Yes   Triage Complete: Triage complete  Chief Complaint left side abd pain  Triage Note Reports LLQ abdominal pain that radiates into the back that started this morning.  Also endorses nausea and vomiting.  Reports unsure of last bm due to taking lots of pain meds. Reports taking vicodin PTA.   Allergies Allergies  Allergen Reactions  . Demerol [Meperidine] Itching  . Oxycodone-Acetaminophen Itching and Rash  . Propoxyphene N-Acetaminophen Itching and Rash    Level of Care/Admitting Diagnosis ED Disposition    ED Disposition Condition [facility] Area: [place] [[id number]]  Level of Care: Telemetry Medical [104]  I expect the patient will be discharged within 24 hours: No (not a candidate for 5C-Observation unit)  Covid Evaluation: Screening Protocol (No Symptoms)  Diagnosis: Renal infarction [facility]) [[id number]]  Admitting Physician: Vianne Bulls [[id number]]  Attending Physician: Vianne Bulls [[id number]]  PT Class (Do Not Modify): Observation [104]  PT Acc Code (Do Not Modify): Observation [10022]       B Medical/Surgery History Past Medical History:  Diagnosis Date  . Anxiety   . Complication of anesthesia   . Depression   . Dyspnea   . History of hiatal hernia    per patient told years ago   . Migraines   . Pneumonia   . Thyroid disease    Past Surgical History:  Procedure Laterality Date  . ABDOMINAL HYSTERECTOMY    . CESAREAN SECTION    . LASIK Bilateral   . LEG SURGERY Left 2004  . THYROID CYST EXCISION    . THYROIDECTOMY     parathyroid as well  . TONSILLECTOMY       A IV Location/Drains/Wounds Patient Lines/Drains/Airways  Status   Active Line/Drains/Airways    Name:   Placement date:   Placement time:   Site:   Days:   Peripheral IV 12/16/18 Right Antecubital   12/16/18    2205    Antecubital   1          Intake/Output Last 24 hours No intake or output data in the 24 hours ending 12/17/18 2505  Labs/Imaging Results for orders placed or performed during the hospital encounter of 12/16/18 (from the past 48 hour(s))  Lipase, blood     Status: None   Collection Time: 12/16/18  7:35 PM  Result Value Ref Range   Lipase 21 11 - 51 U/L    Comment: Performed at [facility], [place] [address], [place], [place] [address]  Comprehensive metabolic panel     Status: Abnormal   Collection Time: 12/16/18  7:35 PM  Result Value Ref Range   Sodium 136 135 - 145 mmol/L   Potassium 4.2 3.5 - 5.1 mmol/L   Chloride 101 98 - 111 mmol/L   CO2 23 22 - 32 mmol/L   Glucose, Bld 142 (H) 70 - 99 mg/dL   BUN 13 8 - 23 mg/dL   Creatinine, Ser 1.05 (H) 0.44 - 1.00 mg/dL   Calcium 9.7 8.9 - 10.3 mg/dL   Total Protein 7.5 6.5 - 8.1 g/dL   Albumin 4.1 3.5 - 5.0 g/dL   AST 27 15 - 41 U/L  ALT 21 0 - 44 U/L   Alkaline Phosphatase 76 38 - 126 U/L   Total Bilirubin 0.6 0.3 - 1.2 mg/dL   GFR calc non Af Amer 57 (L) >60 mL/min   GFR calc Af Amer >60 >60 mL/min   Anion gap 12 5 - 15    Comment: Performed at [place] [address], [place], [place] [address]  CBC     Status: Abnormal   Collection Time: 12/16/18  7:35 PM  Result Value Ref Range   WBC 16.5 (H) 4.0 - 10.5 K/uL   RBC 4.16 3.87 - 5.11 MIL/uL   Hemoglobin 12.2 12.0 - 15.0 g/dL   HCT 37.1 36.0 - 46.0 %   MCV 89.2 80.0 - 100.0 fL   MCH 29.3 26.0 - 34.0 pg   MCHC 32.9 30.0 - 36.0 g/dL   RDW 12.3 11.5 - 15.5 %   Platelets 328 150 - 400 K/uL   nRBC 0.0 0.0 - 0.2 %    Comment: Performed at [facility], [place] [address] [place], [place] [address]  Urinalysis, Routine w reflex microscopic     Status: Abnormal   Collection Time: 12/16/18  9:30 PM   Result Value Ref Range   Color, Urine YELLOW YELLOW   APPearance HAZY (A) CLEAR   Specific Gravity, Urine 1.027 1.005 - 1.030   pH 5.0 5.0 - 8.0   Glucose, UA NEGATIVE NEGATIVE mg/dL   Hgb urine dipstick NEGATIVE NEGATIVE   Bilirubin Urine NEGATIVE NEGATIVE   Ketones, ur 20 (A) NEGATIVE mg/dL   Protein, ur 100 (A) NEGATIVE mg/dL   Nitrite NEGATIVE NEGATIVE   Leukocytes,Ua NEGATIVE NEGATIVE   RBC / HPF 0-5 0 - 5 RBC/hpf   WBC, UA 0-5 0 - 5 WBC/hpf   Bacteria, UA RARE (A) NONE SEEN   Squamous Epithelial / LPF 0-5 0 - 5   Mucus PRESENT    Hyaline Casts, UA PRESENT     Comment: Performed at [facility], [place] [address], [place], [place] [address]   Dg Chest 2 View  Result Date: 12/15/2018 CLINICAL DATA:  Video bronchoscopy. EXAM: CHEST - 2 VIEW COMPARISON:  CT chest dated 11/24/2018. Chest x-ray dated 11/04/2018. FINDINGS: Again noted is a left upper lobe mass. This mass is better appreciated on prior CT and PET-CT. The mass appears grossly stable in size from prior chest x-ray. There is no pneumothorax. No large pleural effusion. The right lung field is essentially clear. There is no acute osseous abnormality. IMPRESSION: Stable appearance of the chest with a persistent left upper lobe mass, better evaluated on prior PET-CT and diagnostic contrast-enhanced CT. Electronically Signed   By: Constance Holster M.D.   On: 12/15/2018 14:31   Ct Abdomen Pelvis W Contrast  Result Date: 12/16/2018 CLINICAL DATA:  Left lower quadrant pain with nausea and vomiting EXAM: CT ABDOMEN AND PELVIS WITH CONTRAST TECHNIQUE: Multidetector CT imaging of the abdomen and pelvis was performed using the standard protocol following bolus administration of intravenous contrast. CONTRAST:  127mL OMNIPAQUE IOHEXOL 300 MG/ML  SOLN COMPARISON:  PET CT 12/08/2018 FINDINGS: Lower chest: Lung bases demonstrate no acute consolidation or effusion. The heart size is normal. Partially visualized small pericardial effusion  measuring up to 9 mm in thickness. Hepatobiliary: No focal liver abnormality is seen. No gallstones, gallbladder wall thickening, or biliary dilatation. Pancreas: Unremarkable. No pancreatic ductal dilatation or surrounding inflammatory changes. Spleen: Normal in size without focal abnormality. Adrenals/Urinary Tract: Right adrenal  gland is normal. Slightly nodular left adrenal gland without well-defined mass. No hydronephrosis. Wedge-shaped hypodensity within the mid right kidney and lower pole of the right kidney, suspect for infarcts. Diffuse hypoenhancement of the left kidney with minimal foci of cortical enhancement, consistent with infarcted left kidney. Occlusive thrombus within the distal left renal artery. Renal vein appears to enhance normally. The bladder is unremarkable Stomach/Bowel: Stomach is within normal limits. Appendix appears normal. No evidence of bowel wall thickening, distention, or inflammatory changes. Vascular/Lymphatic: Nonaneurysmal aorta. Moderate aortic atherosclerosis. Subcentimeter retroperitoneal lymph nodes Reproductive: Status post hysterectomy. No adnexal masses. Other: No free air. Thickening in the left gutter with 9 mm intraperitoneal nodule. Musculoskeletal: Large metastatic lesion involving the right iliac bone. Soft tissue component extends into the right gluteal musculature. IMPRESSION: 1. Diffuse hypoenhancement of the left kidney with small foci of cortical sparing, findings are consistent with diffuse infarction of the left kidney. There is occlusive thrombus present within the distal left renal artery. Right kidney demonstrates small foci of cortical wedge-shaped hypodensity, also suspect for renal infarcts. 2. Left lower quadrant 9 mm peritoneal nodule consistent with metastatic disease 3. Large metastatic lesion involving the right iliac bone. 4. Small pericardial effusion Electronically Signed   By: Donavan Foil M.D.   On: 12/16/2018 23:09    Pending  Labs Unresulted Labs (From admission, onward)    Start     Ordered   12/18/18 0500  Heparin level (unfractionated)  Daily,   R     12/16/18 2336   12/17/18 0600  Heparin level (unfractionated)  Once-Timed,   R     12/16/18 2336   12/17/18 0500  CBC  Daily,   R     12/16/18 2336   12/17/18 5176  Basic metabolic panel  Tomorrow morning,   R     12/17/18 0010   12/16/18 2329  SARS Coronavirus 2 (CEPHEID - Performed in [facility] lab), Hosp Order  (Asymptomatic Patients Labs)  Once,   R    Question:  Rule Out  Answer:  Yes   12/16/18 2329          Vitals/Pain Today's Vitals   12/16/18 2339 12/17/18 0000 12/17/18 0001 12/17/18 0026  BP:      Pulse:  88    Resp:  18    Temp:      TempSrc:      SpO2:  94%    Weight:      Height:      PainSc: 8   5  5      Isolation Precautions No active isolations  Medications Medications  heparin ADULT infusion 100 units/mL (25000 units/246mL sodium chloride 0.45%) (950 Units/hr Intravenous New Bag/Given 12/16/18 2359)  levothyroxine (SYNTHROID) tablet 100 mcg (has no administration in time range)  albuterol (VENTOLIN HFA) 108 (90 Base) MCG/ACT inhaler 1-2 puff (has no administration in time range)  sodium chloride flush (NS) 0.9 % injection 3 mL (has no administration in time range)  0.9 %  sodium chloride infusion (has no administration in time range)  acetaminophen (TYLENOL) tablet 650 mg (has no administration in time range)    Or  acetaminophen (TYLENOL) suppository 650 mg (has no administration in time range)  HYDROcodone-acetaminophen (NORCO/VICODIN) 5-325 MG per tablet 1 tablet (has no administration in time range)  polyethylene glycol (MIRALAX / GLYCOLAX) packet 17 g (has no administration in time range)  bisacodyl (DULCOLAX) EC tablet 5 mg (has no administration in time range)  ondansetron (ZOFRAN) tablet 4  mg (has no administration in time range)    Or  ondansetron (ZOFRAN) injection 4 mg (has no administration in time  range)  HYDROmorphone (DILAUDID) injection 0.5 mg (has no administration in time range)  sodium chloride flush (NS) 0.9 % injection 3 mL (3 mLs Intravenous Given 12/16/18 2206)  ondansetron (ZOFRAN-ODT) disintegrating tablet 4 mg (4 mg Oral Given 12/16/18 1934)  HYDROmorphone (DILAUDID) injection 1 mg (1 mg Intravenous Given 12/16/18 2226)  ondansetron (ZOFRAN) injection 4 mg (4 mg Intravenous Given 12/16/18 2225)  iohexol (OMNIPAQUE) 300 MG/ML solution 100 mL (100 mLs Intravenous Contrast Given 12/16/18 2240)  HYDROmorphone (DILAUDID) injection 1 mg (1 mg Intravenous Given 12/16/18 2344)  heparin bolus via infusion 3,500 Units (3,500 Units Intravenous Bolus from Bag 12/17/18 0000)    Mobility walks High fall risk   Focused Assessments    R Recommendations: See Admitting Provider Note  Report given to:   Additional Notes:

## 2018-12-17 NOTE — Progress Notes (Signed)
ANTICOAGULATION CONSULT NOTE - Follow-Up Consult  Pharmacy Consult for Heparin Indication: Renal infarct/thrombus  Patient Measurements: Height: 5\' 4"  (162.6 cm) Weight: 134 lb 11.2 oz (61.1 kg) IBW/kg (Calculated) : 54.7 Heparin Dosing Weight: 61.2 kg  Vital Signs: Temp: 99.4 F (37.4 C) (05/22 2123) Temp Source: Oral (05/22 2123) BP: 127/75 (05/22 2006) Pulse Rate: 101 (05/22 2006)  Labs: Recent Labs    12/15/18 0942 12/16/18 1935 12/17/18 0605 12/17/18 1256 12/17/18 2204  HGB 13.0 12.2 12.0  --   --   HCT 41.0 37.1 36.4  --   --   PLT 388 328 293  --   --   APTT 32  --   --   --   --   LABPROT 14.2  --   --   --   --   INR 1.1  --   --   --   --   HEPARINUNFRC  --   --  0.31 0.18* 0.29*  CREATININE 0.74 1.05* 1.09*  --   --     Estimated Creatinine Clearance: 46.8 mL/min (A) (by C-G formula based on SCr of 1.09 mg/dL (H)).   Assessment: 80 YOF presenting with abdominal/back pain with N/V found to have occlusive thrombus in left renal artery and pharmacy consulted to start heparin.  Heparin level this evening was just below goal (HL 0.29, goal of 0.3-0.7) on heparin rate 1150 units/hr.    Goal of Therapy:  Heparin level 0.3-0.7 units/ml Monitor platelets by anticoagulation protocol: Yes   Plan:  - Increase IV Heparin to 1250  units/hr  - Will continue to monitor for any signs/symptoms of bleeding  Thank you for allowing pharmacy to be a part of this patient's care.  Erin Hearing PharmD., BCPS Clinical Pharmacist 12/17/2018 10:46 PM

## 2018-12-17 NOTE — Progress Notes (Signed)
Progress Note    [name]  KZS:[id number] DOB: [birth year]  DOA: 12/16/2018 PCP: [facility]    Brief Narrative:     Medical records reviewed and are as summarized below:  Yolanda Miller is an 63 y.o. female with medical history significant for hypothyroidism and metastatic cancer currently being worked up, and now presenting to the emergency department for evaluation of severe left flank and lower quadrant abdomen pain.  She reports developing severe pain in the left flank and lower abdomen this morning and has had some nausea with a couple episodes of nonbloody vomiting associated with this.  She was initially attributing this to constipation as she had not moved her bowels in a couple days, but the pain was becoming more severe and so she came in for evaluation.  She denies any chest pain or palpitations, denies any pain, swelling, or discoloration of the lower extremities, and denies any recent fevers or chills.  She has a mild nonproductive cough that has not changed much in recent weeks.  Patient has been following with oncology for a lung mass concerning for small cell lung cancer and with metastatic right iliac lesion and peritoneal nodule, was scheduled with CT surgery for bronchoscopy and biopsy on 12/17/2018.  Assessment/Plan:   Principal Problem:   Renal infarction [facility]) Active Problems:   Hypothyroidism   Mass of upper lobe of left lung   Renal arterial thrombosis (HCC)   Cancer-related pain   Mild renal insufficiency  Renal artery thrombosis with infarction  - Presents with one day of pain in left flank and LLQ, and is found to have diffuse infarction of left kidney, left renal artery thrombosis, and likely right renal infarctions  - She is in sinus rhythm in ED, denies palpitations, and no evidence for other sites of thrombosis identified  - Etiology could be hypercoagulability of malignancy, though cardiac or other source also  considered  - She has been started on IV heparin infusion in ED  - Continue anticoagulation, continue cardiac monitoring, check echocardiogram   -change to eliquis after procedure on Monday  Metastatic cancer  - Following with oncology for left lung mass with right iliac and peritoneal mets  - She had been scheduled for bronchoscopy with biopsy 12/17/18  -- this has been rescheduled to Monday -patient has been scheduled for radiation in [place] on Tuesday -discussed with orthopedics the iliac met-- this would need oncology orthopedic that is not possible at [facility]-- would need [facility] -patient uses crutches- has an outpatient appointment arranged   Mild renal insufficiency  - Secondary to bilateral renal infarction vs prerenal azotemia in setting of N/V  - Avoid nephrotoxins, hypotension, and dehydration - trend  Hypothyroidism  - Continue Synthroid    Family Communication/Anticipated D/C date and plan/Code Status   DVT prophylaxis: Lovenox ordered. Code Status: Full Code.  Family Communication:  Disposition Plan: after [facility] Consult[facility]:    cvts    Subjective:   C/o "sciatic" pain in right leg -using crutches  Objective:    Vitals:   12/17/18 0045 12/17/18 0205 12/17/18 0541 12/17/18 0851  BP:  (!) 148/78 134/80 123/75  Pulse: 91 80 82 (!) 102  Resp: _0 Temp:  98.1 F (36.7 C) 98.2 F (36.8 C) 98.6 F (37 C)  TempSrc:  Oral Oral Oral  SpO2: 94% 98% 100% 98%  Weight:      Height:  Intake/Output Summary (Last 24 hours) at 12/17/2018 1020 Last data filed at 12/17/2018 0800 Gross per 24 hour  Intake 367.79 ml  Output 0 ml  Net 367.79 ml   Filed Weights   12/16/18 1927  Weight: 61.2 kg    Exam: In bed, NAD rrr No increased work of breathing A+Ox3 No LE edema  Data Reviewed:   I have personally reviewed following labs and imaging studies:  Labs: Labs show the following:   Basic Metabolic Panel: Recent Labs  Lab  12/15/18 0942 12/16/18 1935 12/17/18 0605  NA 138 136 138  K 4.3 4.2 5.0  CL 103 101 99  CO2 _0 GLUCOSE 117* 142* 122*  BUN _1 CREATININE 0.74 1.05* 1.09*  CALCIUM 9.4 9.7 9.3   GFR Estimated Creatinine Clearance: 46.8 mL/min (A) (by C-G formula based on SCr of 1.09 mg/dL (H)). Liver Function Tests: Recent Labs  Lab 12/15/18 0942 12/16/18 1935  AST 15 27  ALT 14 21  ALKPHOS 72 76  BILITOT 0.5 0.6  PROT 7.6 7.5  ALBUMIN 4.2 4.1   Recent Labs  Lab 12/16/18 1935  LIPASE 21   No results for input(s): AMMONIA in the last 168 hours. Coagulation profile Recent Labs  Lab 12/15/18 0942  INR 1.1    CBC: Recent Labs  Lab 12/15/18 0942 12/16/18 1935 12/17/18 0605  WBC 10.5 16.5* 19.6*  HGB 13.0 12.2 12.0  HCT 41.0 37.1 36.4  MCV 92.1 89.2 88.8  PLT 388 328 293   Cardiac Enzymes: No results for input(s): CKTOTAL, CKMB, CKMBINDEX, TROPONINI in the last 168 hours. BNP (last 3 results) No results for input(s): PROBNP in the last 8760 hours. CBG: Recent Labs  Lab 12/17/18 0744  GLUCAP 107*   D-Dimer: No results for input(s): DDIMER in the last 72 hours. Hgb A1c: No results for input(s): HGBA1C in the last 72 hours. Lipid Profile: No results for input(s): CHOL, HDL, LDLCALC, TRIG, CHOLHDL, LDLDIRECT in the last 72 hours. Thyroid function studies: No results for input(s): TSH, T4TOTAL, T3FREE, THYROIDAB in the last 72 hours.  Invalid input(s): FREET3 Anemia work up: No results for input(s): VITAMINB12, FOLATE, FERRITIN, TIBC, IRON, RETICCTPCT in the last 72 hours. Sepsis Labs: Recent Labs  Lab 12/15/18 0942 12/16/18 1935 12/17/18 0605  WBC 10.5 16.5* 19.6*    Microbiology Recent Results (from the past 240 hour(s))  Surgical pcr screen     Status: None   Collection Time: 12/15/18  9:42 AM  Result Value Ref Range Status   MRSA, PCR NEGATIVE NEGATIVE Final   Staphylococcus aureus NEGATIVE NEGATIVE Final    Comment: (NOTE) The Xpert SA  Assay (FDA approved for NASAL specimens in patients 34 years of age and older), is one component of a comprehensive surveillance program. It is not intended to diagnose infection nor to guide or monitor treatment. Performed at [facility], [place] [address] [place], [place] [address]   Novel Coronavirus, NAA (hospital order; send-out to ref lab)     Status: None   Collection Time: 12/15/18 10:13 AM  Result Value Ref Range Status   SARS-CoV-2, NAA NOT DETECTED NOT DETECTED Final    Comment: (NOTE) Testing was performed using the cobas(R) SARS-CoV-2 test. This test was developed and its performance characteristics determined by [employer]. This test has not been FDA cleared or approved. This test has been authorized by FDA under an Emergency Use Authorization (EUA). This test is only authorized for the duration  of time the declaration that circumstances exist justifying the authorization of the emergency use of in vitro diagnostic tests for detection of SARS-CoV-2 virus and/or diagnosis of COVID-19 infection under section 564(b)(1) of the Act, 21 U.S.C. 102HEN-2(D)(7), unless the authorization is terminated or revoked sooner. When diagnostic testing is negative, the possibility of a false negative result should be considered in the context of a patient's recent exposures and the presence of clinical signs and symptoms consistent with COVID-19. An individual without symptoms of COVID-19 and who is not shedding SARS-CoV-2 virus would expect to have  a negative (not detected) result in this assay. Performed At: [facility] [address] [place], [place] [id number] Rush Farmer MD WE:[id number]    [place]  Final    Comment: Performed at [facility], [place] [address] [place], [place] [address]    Procedures and diagnostic studies:  Ct Abdomen Pelvis W Contrast  Result Date: 12/16/2018 CLINICAL DATA:  Left lower  quadrant pain with nausea and vomiting EXAM: CT ABDOMEN AND PELVIS WITH CONTRAST TECHNIQUE: Multidetector CT imaging of the abdomen and pelvis was performed using the standard protocol following bolus administration of intravenous contrast. CONTRAST:  120m OMNIPAQUE IOHEXOL 300 MG/ML  SOLN COMPARISON:  PET CT 12/08/2018 FINDINGS: Lower chest: Lung bases demonstrate no acute consolidation or effusion. The heart size is normal. Partially visualized small pericardial effusion measuring up to 9 mm in thickness. Hepatobiliary: No focal liver abnormality is seen. No gallstones, gallbladder wall thickening, or biliary dilatation. Pancreas: Unremarkable. No pancreatic ductal dilatation or surrounding inflammatory changes. Spleen: Normal in size without focal abnormality. Adrenals/Urinary Tract: Right adrenal gland is normal. Slightly nodular left adrenal gland without well-defined mass. No hydronephrosis. Wedge-shaped hypodensity within the mid right kidney and lower pole of the right kidney, suspect for infarcts. Diffuse hypoenhancement of the left kidney with minimal foci of cortical enhancement, consistent with infarcted left kidney. Occlusive thrombus within the distal left renal artery. Renal vein appears to enhance normally. The bladder is unremarkable Stomach/Bowel: Stomach is within normal limits. Appendix appears normal. No evidence of bowel wall thickening, distention, or inflammatory changes. Vascular/Lymphatic: Nonaneurysmal aorta. Moderate aortic atherosclerosis. Subcentimeter retroperitoneal lymph nodes Reproductive: Status post hysterectomy. No adnexal masses. Other: No free air. Thickening in the left gutter with 9 mm intraperitoneal nodule. Musculoskeletal: Large metastatic lesion involving the right iliac bone. Soft tissue component extends into the right gluteal musculature. IMPRESSION: 1. Diffuse hypoenhancement of the left kidney with small foci of cortical sparing, findings are consistent with diffuse  infarction of the left kidney. There is occlusive thrombus present within the distal left renal artery. Right kidney demonstrates small foci of cortical wedge-shaped hypodensity, also suspect for renal infarcts. 2. Left lower quadrant 9 mm peritoneal nodule consistent with metastatic disease 3. Large metastatic lesion involving the right iliac bone. 4. Small pericardial effusion Electronically Signed   By: KDonavan FoilM.D.   On: 12/16/2018 23:09    Medications:    levothyroxine  100 mcg Oral QAC breakfast   sodium chloride flush  3 mL Intravenous Q12H   Continuous Infusions:  heparin 950 Units/hr (12/16/18 2359)     LOS: 0 days   JGeradine Girt [facility]   How to contact the [facility] Center Of [facility] PointAttending or Consulting provider [place] covering provider during after hours [place] for this patient?  1. Check the care team in [facility] look for a) attending/consulting TR[facility] provider listed and b) the TAdv[facility] Centerteam listed 2.  Log into [email or web address] and use [place]'s universal password to access. If you do not have the password, please contact the hospital operator. 3. Locate the [facility] provider you are looking for under [facility] and page to a number that you can be directly reached. 4. If you still have difficulty reaching the provider, please page the [facility] (Director on Call) for the Hospitalists listed on amion for assistance.  12/17/2018, 10:20 AM

## 2018-12-17 NOTE — Progress Notes (Addendum)
      [address]       [place] [address]             [phone]      Yolanda Miller was scheduled for a bronchoscopy today. She has a large left hilar mass, almost certainly a lung cancer, most likely a small cell carcinoma. She was admitted last night with left renal infarction.   Workup of renal artery thrombus takes precedence acutely. I do not think it would be wise to stop anticoagulation now only a few hours after it was started. Heparin will need to held for 6 hours preop and 12 hours postop.   Therefore, will cancel bronchoscopy and reschedule for Monday 5/25.  She has a large lytic iliac metastasis. May want to consider orthopedic consultation to address that while she is here.  Thank you for informing me of Mrs. Mochizuki's admission  Revonda Standard. Roxan Hockey, MD [facility] [phone]   Correction of above Monday is an OR holiday and I will be out of town Plan bronchoscopy on Tuesday 5/26 Orders written  Remo Lipps C. Roxan Hockey, MD [facility] [phone]

## 2018-12-18 LAB — HEPARIN LEVEL (UNFRACTIONATED)
Heparin Unfractionated: 0.31 IU/mL (ref 0.30–0.70)
Heparin Unfractionated: 0.22 IU/mL — ABNORMAL LOW (ref 0.30–0.70)
Heparin Unfractionated: 0.22 IU/mL — ABNORMAL LOW (ref 0.30–0.70)

## 2018-12-18 LAB — BASIC METABOLIC PANEL
Anion gap: 14 (ref 5–15)
BUN: 11 mg/dL (ref 8–23)
CO2: 23 mmol/L (ref 22–32)
Calcium: 9 mg/dL (ref 8.9–10.3)
Chloride: 98 mmol/L (ref 98–111)
Creatinine, Ser: 1.41 mg/dL — ABNORMAL HIGH (ref 0.44–1.00)
GFR calc Af Amer: 46 mL/min — ABNORMAL LOW (ref >60)
GFR calc non Af Amer: 40 mL/min — ABNORMAL LOW (ref >60)
Glucose, Bld: 134 mg/dL — ABNORMAL HIGH (ref 70–99)
Potassium: 4.6 mmol/L (ref 3.5–5.1)
Sodium: 135 mmol/L (ref 135–145)

## 2018-12-18 LAB — CBC
HCT: 37.1 % (ref 36.0–46.0)
Hemoglobin: 12 g/dL (ref 12.0–15.0)
MCH: 28.8 pg (ref 26.0–34.0)
MCHC: 32.3 g/dL (ref 30.0–36.0)
MCV: 89.2 fL (ref 80.0–100.0)
Platelets: 241 10*3/uL (ref 150–400)
RBC: 4.16 MIL/uL (ref 3.87–5.11)
RDW: 12.4 % (ref 11.5–15.5)
WBC: 18.7 10*3/uL — ABNORMAL HIGH (ref 4.0–10.5)
nRBC: 0 % (ref 0.0–0.2)

## 2018-12-18 LAB — GLUCOSE, CAPILLARY: Glucose-Capillary: 118 mg/dL — ABNORMAL HIGH (ref 70–99)

## 2018-12-18 MED ORDER — SODIUM CHLORIDE 0.9 % IV SOLN
INTRAVENOUS | Status: DC
  Administered 2018-12-18 – 2018-12-21 (×6): via INTRAVENOUS

## 2018-12-18 NOTE — Progress Notes (Signed)
Progress Note    Yolanda Miller  JKQ:[id number] DOB: September 16, 1955  DOA: 12/16/2018 PCP: [facility]    Brief Narrative:     Medical records reviewed and are as summarized below:  Yolanda Miller is an 63 y.o. female with medical history significant for hypothyroidism and metastatic cancer currently being worked up, and now presenting to the emergency department for evaluation of severe left flank and lower quadrant abdomen pain.  She reports developing severe pain in the left flank and lower abdomen this morning and has had some nausea with a couple episodes of nonbloody vomiting associated with this.  She was initially attributing this to constipation as she had not moved her bowels in a couple days, but the pain was becoming more severe and so she came in for evaluation.  She denies any chest pain or palpitations, denies any pain, swelling, or discoloration of the lower extremities, and denies any recent fevers or chills.  She has a mild nonproductive cough that has not changed much in recent weeks.  Patient has been following with oncology for a lung mass concerning for small cell lung cancer and with metastatic right iliac lesion and peritoneal nodule, was scheduled with CT surgery for bronchoscopy and biopsy on 12/17/2018.  Assessment/Plan:   Principal Problem:   Renal infarction [facility]) Active Problems:   Hypothyroidism   Mass of upper lobe of left lung   Renal arterial thrombosis (HCC)   Cancer-related pain   Mild renal insufficiency   Renal infarct ([place])  Renal artery thrombosis with infarction  - Presents with one day of pain in left flank and LLQ, and is found to have diffuse infarction of left kidney, left renal artery thrombosis, and likely right renal infarctions  - She is in sinus rhythm in ED, denies palpitations, and no evidence for other sites of thrombosis identified  - Etiology could be hypercoagulability of malignancy - She has been  started on IV heparin infusion in ED  - Continue anticoagulation, continue cardiac monitoring, check echocardiogram   -change to eliquis after procedure   Metastatic cancer  - Following with oncology for left lung mass with right iliac and peritoneal mets  - She had been scheduled for bronchoscopy with biopsy 12/17/18  -- this has been rescheduled to Tuesday -patient has been scheduled for radiation in [place] on Tuesday -discussed with orthopedics the iliac met-- this would need oncology orthopedic that is not possible at [facility]-- would need [facility] -patient uses crutches- has an outpatient appointment arranged  AKI - appears dry -start IVF as she is not taking in much PO  Hypothyroidism  - Continue Synthroid    Family Communication/Anticipated D/C date and plan/Code Status   DVT prophylaxis: Lovenox ordered. Code Status: Full Code.  Family Communication:  Disposition Plan: after bronch   Medical Consultants:    cvts    Subjective:   C/o "sciatic" pain in right leg -using crutches  Objective:    Vitals:   12/17/18 2123 12/18/18 0409 12/18/18 0628 12/18/18 1049  BP:  113/74  111/75  Pulse:  (!) 104  (!) 116  Resp:  18  18  Temp: 99.4 F (37.4 C) (!) 101 F (38.3 C) 99.3 F (37.4 C) 99.8 F (37.7 C)  TempSrc: Oral Oral Oral Oral  SpO2:  94%  97%  Weight: 61.1 kg     Height:        Intake/Output Summary (Last 24 hours) at 12/18/2018 1234 Last data  filed at 12/18/2018 0600 Gross per 24 hour  Intake 622.5 ml  Output 850 ml  Net -227.5 ml   Filed Weights   12/16/18 1927 12/17/18 2123  Weight: 61.2 kg 61.1 kg    Exam: In bed, ill appearing rrr +BS, soft No LE edema Mucous membranes dry   Data Reviewed:   I have personally reviewed following labs and imaging studies:  Labs: Labs show the following:   Basic Metabolic Panel: Recent Labs  Lab 12/15/18 0942 12/16/18 1935 12/17/18 0605 12/18/18 0449  NA 138 136 138 135  K 4.3 4.2 5.0 4.6  CL  103 101 99 98  CO2 23 23 25 23   GLUCOSE 117* 142* 122* 134*  BUN 11 13 14 11   CREATININE 0.74 1.05* 1.09* 1.41*  CALCIUM 9.4 9.7 9.3 9.0   GFR Estimated Creatinine Clearance: 36.2 mL/min (A) (by C-G formula based on SCr of 1.41 mg/dL (H)). Liver Function Tests: Recent Labs  Lab 12/15/18 0942 12/16/18 1935  AST 15 27  ALT 14 21  ALKPHOS 72 76  BILITOT 0.5 0.6  PROT 7.6 7.5  ALBUMIN 4.2 4.1   Recent Labs  Lab 12/16/18 1935  LIPASE 21   No results for input(s): AMMONIA in the last 168 hours. Coagulation profile Recent Labs  Lab 12/15/18 0942  INR 1.1    CBC: Recent Labs  Lab 12/15/18 0942 12/16/18 1935 12/17/18 0605 12/18/18 0449  WBC 10.5 16.5* 19.6* 18.7*  HGB 13.0 12.2 12.0 12.0  HCT 41.0 37.1 36.4 37.1  MCV 92.1 89.2 88.8 89.2  PLT 388 328 293 241   Cardiac Enzymes: No results for input(s): CKTOTAL, CKMB, CKMBINDEX, TROPONINI in the last 168 hours. BNP (last 3 results) No results for input(s): PROBNP in the last 8760 hours. CBG: Recent Labs  Lab 12/17/18 0744 12/18/18 0650  GLUCAP 107* 118*   D-Dimer: No results for input(s): DDIMER in the last 72 hours. Hgb A1c: No results for input(s): HGBA1C in the last 72 hours. Lipid Profile: No results for input(s): CHOL, HDL, LDLCALC, TRIG, CHOLHDL, LDLDIRECT in the last 72 hours. Thyroid function studies: No results for input(s): TSH, T4TOTAL, T3FREE, THYROIDAB in the last 72 hours.  Invalid input(s): FREET3 Anemia work up: No results for input(s): VITAMINB12, FOLATE, FERRITIN, TIBC, IRON, RETICCTPCT in the last 72 hours. Sepsis Labs: Recent Labs  Lab 12/15/18 0942 12/16/18 1935 12/17/18 0605 12/18/18 0449  WBC 10.5 16.5* 19.6* 18.7*    Microbiology Recent Results (from the past 240 hour(s))  Surgical pcr screen     Status: None   Collection Time: 12/15/18  9:42 AM  Result Value Ref Range Status   MRSA, PCR NEGATIVE NEGATIVE Final   Staphylococcus aureus NEGATIVE NEGATIVE Final     Comment: (NOTE) The Xpert SA Assay (FDA approved for NASAL specimens in patients 64 years of age and older), is one component of a comprehensive surveillance program. It is not intended to diagnose infection nor to guide or monitor treatment. Performed at [facility], [place] [address], [place], [place] [address]   Novel Coronavirus, NAA (hospital order; send-out to ref lab)     Status: None   Collection Time: 12/15/18 10:13 AM  Result Value Ref Range Status   SARS-CoV-2, NAA NOT DETECTED NOT DETECTED Final    Comment: (NOTE) Testing was performed using the cobas(R) SARS-CoV-2 test. This test was developed and its performance characteristics determined by [employer]. This test has not been FDA cleared or approved. This test has  been authorized by FDA under an Emergency Use Authorization (EUA). This test is only authorized for the duration of time the declaration that circumstances exist justifying the authorization of the emergency use of in vitro diagnostic tests for detection of SARS-CoV-2 virus and/or diagnosis of COVID-19 infection under section 564(b)(1) of the Act, 21 U.S.C. 431VQM-0(Q)(6), unless the authorization is terminated or revoked sooner. When diagnostic testing is negative, the possibility of a false negative result should be considered in the context of a patient's recent exposures and the presence of clinical signs and symptoms consistent with COVID-19. An individual without symptoms of COVID-19 and who is not shedding SARS-CoV-2 virus would expect to have  a negative (not detected) result in this assay. Performed At: [facility] [address] [place], [place] [id number] Rush Farmer MD IZ:[id number]    [place]  Final    Comment: Performed at [facility], [place] [address], [place], [place] [address]    Procedures and diagnostic studies:  Ct Abdomen Pelvis W Contrast  Result Date:  12/16/2018 CLINICAL DATA:  Left lower quadrant pain with nausea and vomiting EXAM: CT ABDOMEN AND PELVIS WITH CONTRAST TECHNIQUE: Multidetector CT imaging of the abdomen and pelvis was performed using the standard protocol following bolus administration of intravenous contrast. CONTRAST:  14m OMNIPAQUE IOHEXOL 300 MG/ML  SOLN COMPARISON:  PET CT 12/08/2018 FINDINGS: Lower chest: Lung bases demonstrate no acute consolidation or effusion. The heart size is normal. Partially visualized small pericardial effusion measuring up to 9 mm in thickness. Hepatobiliary: No focal liver abnormality is seen. No gallstones, gallbladder wall thickening, or biliary dilatation. Pancreas: Unremarkable. No pancreatic ductal dilatation or surrounding inflammatory changes. Spleen: Normal in size without focal abnormality. Adrenals/Urinary Tract: Right adrenal gland is normal. Slightly nodular left adrenal gland without well-defined mass. No hydronephrosis. Wedge-shaped hypodensity within the mid right kidney and lower pole of the right kidney, suspect for infarcts. Diffuse hypoenhancement of the left kidney with minimal foci of cortical enhancement, consistent with infarcted left kidney. Occlusive thrombus within the distal left renal artery. Renal vein appears to enhance normally. The bladder is unremarkable Stomach/Bowel: Stomach is within normal limits. Appendix appears normal. No evidence of bowel wall thickening, distention, or inflammatory changes. Vascular/Lymphatic: Nonaneurysmal aorta. Moderate aortic atherosclerosis. Subcentimeter retroperitoneal lymph nodes Reproductive: Status post hysterectomy. No adnexal masses. Other: No free air. Thickening in the left gutter with 9 mm intraperitoneal nodule. Musculoskeletal: Large metastatic lesion involving the right iliac bone. Soft tissue component extends into the right gluteal musculature. IMPRESSION: 1. Diffuse hypoenhancement of the left kidney with small foci of cortical  sparing, findings are consistent with diffuse infarction of the left kidney. There is occlusive thrombus present within the distal left renal artery. Right kidney demonstrates small foci of cortical wedge-shaped hypodensity, also suspect for renal infarcts. 2. Left lower quadrant 9 mm peritoneal nodule consistent with metastatic disease 3. Large metastatic lesion involving the right iliac bone. 4. Small pericardial effusion Electronically Signed   By: KDonavan FoilM.D.   On: 12/16/2018 23:09    Medications:    feeding supplement  1 Container Oral TID WC   feeding supplement (ENSURE ENLIVE)  237 mL Oral Daily   levothyroxine  100 mcg Oral QAC breakfast   multivitamin with minerals  1 tablet Oral Daily   sodium chloride flush  3 mL Intravenous Q12H   Continuous Infusions:  sodium chloride 50 mL/hr at 12/18/18 1058   heparin 1,250 Units/hr (12/18/18 0913)     LOS:  1 day   [place] [facility]   How to contact the [facility] Attending or Consulting provider [place] or covering provider during after hours [place], for this patient?  1. Check the care team in [facility] and look for a) attending/consulting [facility] provider listed and b) the [facility] team listed 2. Log into [email or web address] and use [place]'s universal password to access. If you do not have the password, please contact the hospital operator. 3. Locate the [facility] provider you are looking for under [facility] and page to a number that you can be directly reached. 4. If you still have difficulty reaching the provider, please page the [facility] (Director on Call) for the Hospitalists listed on amion for assistance.  12/18/2018, 12:34 PM

## 2018-12-18 NOTE — Progress Notes (Addendum)
ANTICOAGULATION CONSULT NOTE - Follow-Up Consult  Pharmacy Consult for Heparin Indication: Renal infarct/thrombus  Patient Measurements: Height: 5\' 4"  (162.6 cm) Weight: 134 lb 11.2 oz (61.1 kg) IBW/kg (Calculated) : 54.7 Heparin Dosing Weight: 61.2 kg  Vital Signs: Temp: 99.3 F (37.4 C) (05/23 0628) Temp Source: Oral (05/23 0628) BP: 113/74 (05/23 0409) Pulse Rate: 104 (05/23 0409)  Labs: Recent Labs    12/15/18 0942 12/16/18 1935  12/17/18 0605 12/17/18 1256 12/17/18 2204 12/18/18 0449  HGB 13.0 12.2  --  12.0  --   --  12.0  HCT 41.0 37.1  --  36.4  --   --  37.1  PLT 388 328  --  293  --   --  241  APTT 32  --   --   --   --   --   --   LABPROT 14.2  --   --   --   --   --   --   INR 1.1  --   --   --   --   --   --   HEPARINUNFRC  --   --    < > 0.31 0.18* 0.29* 0.31  CREATININE 0.74 1.05*  --  1.09*  --   --  1.41*   < > = values in this interval not displayed.    Estimated Creatinine Clearance: 36.2 mL/min (A) (by C-G formula based on SCr of 1.41 mg/dL (H)).   Assessment: Yolanda Miller presenting with abdominal/back pain with N/V found to have occlusive thrombus in left renal artery and pharmacy consulted to start heparin.  Heparin level therapeutic x 1 (HL 0.31, goal of 0.3-0.7) on heparin rate 1250 units/hr.   Goal of Therapy:  Heparin level 0.3-0.7 units/ml Monitor platelets by anticoagulation protocol: Yes   Plan:  - Continue IV heparin at 1250  units/hr   - Confirmatory heparin level 6 hours from last level - Will continue to monitor for any signs/symptoms of bleeding - IV heparin to stop 5/26 at 0130 per TCTS for bronchoscopy on 5/26 (noted hold heparin 6hr preop and 12 hours postop)  Thank you for allowing pharmacy to be a part of this patient's care.  [name], PharmD PGY1 Pharmacy Resident Phone [phone] 12/18/2018       9:05 AM    Addendum: RN notified that heparin infusion was stopped at 0725 this morning due to loss of IV  access. Heparin infusion restarted at 0913, therefore confirmatory heparin level is subtherapeutic, as expected.  Plan: Continue heparin at current rate of 1250 units/hr and check repeat heparin level 6 hours after infusion resumed.  [name], PharmD PGY1 Pharmacy Resident Phone [phone] 12/18/2018       12:17 PM

## 2018-12-18 NOTE — Progress Notes (Signed)
ANTICOAGULATION CONSULT NOTE - Follow-Up Consult  Pharmacy Consult for Heparin Indication: Renal infarct/thrombus  Patient Measurements: Height: 5\' 4"  (162.6 cm) Weight: 134 lb 11.2 oz (61.1 kg) IBW/kg (Calculated) : 54.7 Heparin Dosing Weight: 61.2 kg  Vital Signs: Temp: 99.8 F (37.7 C) (05/23 1049) Temp Source: Oral (05/23 1049) BP: 111/75 (05/23 1049) Pulse Rate: 116 (05/23 1049)  Labs: Recent Labs    12/16/18 1935 12/17/18 0605  12/18/18 0449 12/18/18 1100 12/18/18 1424  HGB 12.2 12.0  --  12.0  --   --   HCT 37.1 36.4  --  37.1  --   --   PLT 328 293  --  241  --   --   HEPARINUNFRC  --  0.31   < > 0.31 0.22* 0.22*  CREATININE 1.05* 1.09*  --  1.41*  --   --    < > = values in this interval not displayed.    Estimated Creatinine Clearance: 36.2 mL/min (A) (by C-G formula based on SCr of 1.41 mg/dL (H)).   Assessment: 52 YOF presenting with abdominal/back pain with N/V found to have occlusive thrombus in left renal artery and pharmacy consulted to start heparin.  Heparin level slightly subtherapeutic after being off this morning for 2 hr. Has been running without interruptions since ~0915 per RN.  Goal of Therapy:  Heparin level 0.3-0.7 units/ml Monitor platelets by anticoagulation protocol: Yes   Plan:  -Increase heparin slightly to 1350 units/hr -Recheck heparin level later this evening -IV heparin to stop 5/26 at 0130 per TCTS for bronchoscopy on 5/26 (noted hold heparin 6hr preop and 12 hours postop)   Arrie Senate, PharmD, BCPS Clinical Pharmacist [phone] Please check AMION for all [place] numbers 12/18/2018

## 2018-12-19 LAB — CBC
HCT: 31.9 % — ABNORMAL LOW (ref 36.0–46.0)
Hemoglobin: 10.4 g/dL — ABNORMAL LOW (ref 12.0–15.0)
MCH: 29 pg (ref 26.0–34.0)
MCHC: 32.6 g/dL (ref 30.0–36.0)
MCV: 88.9 fL (ref 80.0–100.0)
Platelets: 215 10*3/uL (ref 150–400)
RBC: 3.59 MIL/uL — ABNORMAL LOW (ref 3.87–5.11)
RDW: 12.3 % (ref 11.5–15.5)
WBC: 14.7 10*3/uL — ABNORMAL HIGH (ref 4.0–10.5)
nRBC: 0 % (ref 0.0–0.2)

## 2018-12-19 LAB — BASIC METABOLIC PANEL
Anion gap: 12 (ref 5–15)
BUN: 9 mg/dL (ref 8–23)
CO2: 25 mmol/L (ref 22–32)
Calcium: 8.6 mg/dL — ABNORMAL LOW (ref 8.9–10.3)
Chloride: 102 mmol/L (ref 98–111)
Creatinine, Ser: 1.15 mg/dL — ABNORMAL HIGH (ref 0.44–1.00)
GFR calc Af Amer: 59 mL/min — ABNORMAL LOW (ref >60)
GFR calc non Af Amer: 51 mL/min — ABNORMAL LOW (ref >60)
Glucose, Bld: 146 mg/dL — ABNORMAL HIGH (ref 70–99)
Potassium: 4.1 mmol/L (ref 3.5–5.1)
Sodium: 139 mmol/L (ref 135–145)

## 2018-12-19 LAB — HEPARIN LEVEL (UNFRACTIONATED)
Heparin Unfractionated: 0.25 IU/mL — ABNORMAL LOW (ref 0.30–0.70)
Heparin Unfractionated: 0.32 IU/mL (ref 0.30–0.70)
Heparin Unfractionated: 0.31 IU/mL (ref 0.30–0.70)

## 2018-12-19 LAB — GLUCOSE, CAPILLARY: Glucose-Capillary: 130 mg/dL — ABNORMAL HIGH (ref 70–99)

## 2018-12-19 NOTE — Progress Notes (Signed)
Progress Note    Yolanda Miller  IEP:[id number] DOB: 02/21/1956  DOA: 12/16/2018 PCP: [facility]    Brief Narrative:     Medical records reviewed and are as summarized below:  Yolanda Miller is an 63 y.o. female with medical history significant for hypothyroidism and metastatic cancer currently being worked up, and now presenting to the emergency department for evaluation of severe left flank and lower quadrant abdomen pain.  She reports developing severe pain in the left flank and lower abdomen this morning and has had some nausea with a couple episodes of nonbloody vomiting associated with this.  She was initially attributing this to constipation as she had not moved her bowels in a couple days, but the pain was becoming more severe and so she came in for evaluation.  She denies any chest pain or palpitations, denies any pain, swelling, or discoloration of the lower extremities, and denies any recent fevers or chills.  She has a mild nonproductive cough that has not changed much in recent weeks.  Patient has been following with oncology for a lung mass concerning for small cell lung cancer and with metastatic right iliac lesion and peritoneal nodule, was scheduled with CT surgery for bronchoscopy and biopsy on 12/17/2018.  Assessment/Plan:   Principal Problem:   Renal infarction [facility]) Active Problems:   Hypothyroidism   Mass of upper lobe of left lung   Renal arterial thrombosis (HCC)   Cancer-related pain   Mild renal insufficiency   Renal infarct ([place])  Renal artery thrombosis with infarction  - Presents with one day of pain in left flank and LLQ, and is found to have diffuse infarction of left kidney, left renal artery thrombosis, and likely right renal infarctions  - She is in sinus rhythm in ED, denies palpitations, and no evidence for other sites of thrombosis identified  - Etiology could be hypercoagulability of malignancy - She has been  started on IV heparin infusion in ED  - Continue anticoagulation -change to eliquis after procedure   Metastatic cancer  - Following with oncology for left lung mass with right iliac and peritoneal mets  - She had been scheduled for bronchoscopy with biopsy 12/17/18  -- this has been rescheduled to Tuesday -patient has been scheduled for radiation in [place] on Tuesday -discussed with orthopedics the iliac met-- this would need oncology orthopedic that is not possible at [facility]-- would need [facility] -patient uses crutches- has an outpatient appointment arranged  AKI - appears dry -continue IVF as she is still not taking in much PO -Cr improved  leukocyotsis -trending down  Hypothyroidism  - Continue Synthroid    Family Communication/Anticipated D/C date and plan/Code Status   DVT prophylaxis: Lovenox ordered. Code Status: Full Code.  Family Communication:  Disposition Plan: after bronch-- will need to  Be changed over to NOAC-- preferably eliquis   Medical Consultants:    cvts    Subjective:   sleeping  Objective:    Vitals:   12/18/18 0628 12/18/18 1049 12/18/18 1938 12/19/18 0517  BP:  111/75 129/71 120/75  Pulse:  (!) 116 (!) 104 93  Resp:  _0 Temp: 99.3 F (37.4 C) 99.8 F (37.7 C) 99.7 F (37.6 C) 99.1 F (37.3 C)  TempSrc: Oral Oral Oral Oral  SpO2:  97% 97% 94%  Weight:   62.3 kg   Height:        Intake/Output Summary (Last 24 hours) at 12/19/2018  [place] filed at 12/19/2018 0900 Gross per 24 hour  Intake 1126.5 ml  Output 1650 ml  Net -523.5 ml   Filed Weights   12/16/18 1927 12/17/18 2123 12/18/18 1938  Weight: 61.2 kg 61.1 kg 62.3 kg    Exam: Sleeping in bed No increased work of breathing Appears comfortable   Data Reviewed:   I have personally reviewed following labs and imaging studies:  Labs: Labs show the following:   Basic Metabolic Panel: Recent Labs  Lab 12/15/18 0942 12/16/18 1935 12/17/18 0605  12/18/18 0449 12/19/18 0757  NA 138 136 138 135 139  K 4.3 4.2 5.0 4.6 4.1  CL 103 101 99 98 102  CO2 _0 GLUCOSE 117* 142* 122* 134* 146*  BUN _1 CREATININE 0.74 1.05* 1.09* 1.41* 1.15*  CALCIUM 9.4 9.7 9.3 9.0 8.6*   GFR Estimated Creatinine Clearance: 44.4 mL/min (A) (by C-G formula based on SCr of 1.15 mg/dL (H)). Liver Function Tests: Recent Labs  Lab 12/15/18 0942 12/16/18 1935  AST 15 27  ALT 14 21  ALKPHOS 72 76  BILITOT 0.5 0.6  PROT 7.6 7.5  ALBUMIN 4.2 4.1   Recent Labs  Lab 12/16/18 1935  LIPASE 21   No results for input(s): AMMONIA in the last 168 hours. Coagulation profile Recent Labs  Lab 12/15/18 0942  INR 1.1    CBC: Recent Labs  Lab 12/15/18 0942 12/16/18 1935 12/17/18 0605 12/18/18 0449 12/18/18 2358  WBC 10.5 16.5* 19.6* 18.7* 14.7*  HGB 13.0 12.2 12.0 12.0 10.4*  HCT 41.0 37.1 36.4 37.1 31.9*  MCV 92.1 89.2 88.8 89.2 88.9  PLT 388 328 293 241 215   Cardiac Enzymes: No results for input(s): CKTOTAL, CKMB, CKMBINDEX, TROPONINI in the last 168 hours. BNP (last 3 results) No results for input(s): PROBNP in the last 8760 hours. CBG: Recent Labs  Lab 12/17/18 0744 12/18/18 0650 12/19/18 0720  GLUCAP 107* 118* 130*   D-Dimer: No results for input(s): DDIMER in the last 72 hours. Hgb A1c: No results for input(s): HGBA1C in the last 72 hours. Lipid Profile: No results for input(s): CHOL, HDL, LDLCALC, TRIG, CHOLHDL, LDLDIRECT in the last 72 hours. Thyroid function studies: No results for input(s): TSH, T4TOTAL, T3FREE, THYROIDAB in the last 72 hours.  Invalid input(s): FREET3 Anemia work up: No results for input(s): VITAMINB12, FOLATE, FERRITIN, TIBC, IRON, RETICCTPCT in the last 72 hours. Sepsis Labs: Recent Labs  Lab 12/16/18 1935 12/17/18 0605 12/18/18 0449 12/18/18 2358  WBC 16.5* 19.6* 18.7* 14.7*    Microbiology Recent Results (from the past 240 hour(s))  Surgical pcr screen     Status:  None   Collection Time: 12/15/18  9:42 AM  Result Value Ref Range Status   MRSA, PCR NEGATIVE NEGATIVE Final   Staphylococcus aureus NEGATIVE NEGATIVE Final    Comment: (NOTE) The Xpert SA Assay (FDA approved for NASAL specimens in patients 39 years of age and older), is one component of a comprehensive surveillance program. It is not intended to diagnose infection nor to guide or monitor treatment. Performed at [facility], [place] [address], [place], [place] [address]   Novel Coronavirus, NAA (hospital order; send-out to ref lab)     Status: None   Collection Time: 12/15/18 10:13 AM  Result Value Ref Range Status   SARS-CoV-2, NAA NOT DETECTED NOT DETECTED Final    Comment: (NOTE) Testing was performed using the cobas(R) SARS-CoV-2 test.  This test was developed and its performance characteristics determined by [employer]. This test has not been FDA cleared or approved. This test has been authorized by FDA under an Emergency Use Authorization (EUA). This test is only authorized for the duration of time the declaration that circumstances exist justifying the authorization of the emergency use of in vitro diagnostic tests for detection of SARS-CoV-2 virus and/or diagnosis of COVID-19 infection under section 564(b)(1) of the Act, 21 U.S.C. 884ZYS-0(Y)(3), unless the authorization is terminated or revoked sooner. When diagnostic testing is negative, the possibility of a false negative result should be considered in the context of a patient's recent exposures and the presence of clinical signs and symptoms consistent with COVID-19. An individual without symptoms of COVID-19 and who is not shedding SARS-CoV-2 virus would expect to have  a negative (not detected) result in this assay. Performed At: [facility] [address] [place], [place] [id number] Rush Farmer MD TF:[id number]    [place]  Final    Comment:  Performed at [facility], [place] [address] [place], [place] [address]    Procedures and diagnostic studies:  No results found.  Medications:   . feeding supplement  1 Container Oral TID WC  . feeding supplement (ENSURE ENLIVE)  237 mL Oral Daily  . levothyroxine  100 mcg Oral QAC breakfast  . multivitamin with minerals  1 tablet Oral Daily  . sodium chloride flush  3 mL Intravenous Q12H   Continuous Infusions: . sodium chloride 100 mL/hr at 12/19/18 0815  . heparin 1,500 Units/hr (12/19/18 0814)     LOS: 2 days   Geradine Girt  [facility]   How to contact the [facility] Attending or Consulting provider [place] or covering provider during after hours [facility], for this patient?  1. Check the care team in [facility] and look for a) attending/consulting TR[facility] provider listed and b) the [facility] team listed 2. Log into [email or web address] and use [facility]'s universal password to access. If you do not have the password, please contact the hospital operator. 3. Locate the [facility] provider you are looking for under [facility] and page to a number that you can be directly reached. 4. If you still have difficulty reaching the provider, please page the [facility] (Director on Call) for the Hospitalists listed on amion for assistance.  12/19/2018, 12:36 PM

## 2018-12-19 NOTE — Progress Notes (Signed)
ANTICOAGULATION CONSULT NOTE - Follow-Up Consult  Pharmacy Consult for Heparin Indication: Renal infarct/thrombus  Patient Measurements: Height: 5\' 4"  (162.6 cm) Weight: 134 lb 11.2 oz (61.1 kg) IBW/kg (Calculated) : 54.7 Heparin Dosing Weight: 61.2 kg  Vital Signs: Temp: 99.7 F (37.6 C) (05/23 1938) Temp Source: Oral (05/23 1938) BP: 129/71 (05/23 1938) Pulse Rate: 104 (05/23 1938)  Labs: Recent Labs    12/16/18 1935 12/17/18 0605  12/18/18 0449 12/18/18 1100 12/18/18 1424 12/18/18 2358  HGB 12.2 12.0  --  12.0  --   --  10.4*  HCT 37.1 36.4  --  37.1  --   --  31.9*  PLT 328 293  --  241  --   --  215  HEPARINUNFRC  --  0.31   < > 0.31 0.22* 0.22* 0.25*  CREATININE 1.05* 1.09*  --  1.41*  --   --   --    < > = values in this interval not displayed.    Estimated Creatinine Clearance: 36.2 mL/min (A) (by C-G formula based on SCr of 1.41 mg/dL (H)).   Assessment: 15 YOF presenting with abdominal/back pain with N/V found to have occlusive thrombus in left renal artery and pharmacy consulted to start heparin.  Heparin level came back subtherapeutic at 0.25, on 1350 units/hr. Hgb 10.4, plt 215 - monitor closely. No s/sx of bleeding. No infusion issues.   Goal of Therapy:  Heparin level 0.3-0.7 units/ml Monitor platelets by anticoagulation protocol: Yes   Plan:  -Increase heparin to 1500 units/hr -Monitor heparin level with AM labs -IV heparin to stop 5/26 at 0130 per TCTS for bronchoscopy on 5/26 (noted hold heparin 6hr preop and 12 hours postop)  Antonietta Jewel, PharmD, BCCCP Clinical Pharmacist  Pager: [phone] Phone: [phone] Please check AMION for all [place] numbers 12/19/2018

## 2018-12-19 NOTE — Care Management (Signed)
Benefit check for xaralto and eliquis submitted. Results may not post until Tuesday, considering [place]. Patient has [employer] and would be able to use 30 day free card and $10 copay card. However at this time it is not clear if there would be a prior auth without actually calling the Rx into a pharmacy and it being run.

## 2018-12-19 NOTE — Progress Notes (Signed)
ANTICOAGULATION CONSULT NOTE - Follow-Up Consult  Pharmacy Consult for Heparin Indication: Renal infarct/thrombus  Patient Measurements: Height: 5\' 4"  (162.6 cm) Weight: 137 lb 5.6 oz (62.3 kg) IBW/kg (Calculated) : 54.7 Heparin Dosing Weight: 61.2 kg  Vital Signs: Temp: 99.1 F (37.3 C) (05/24 0517) Temp Source: Oral (05/24 0517) BP: 120/75 (05/24 0517) Pulse Rate: 93 (05/24 0517)  Labs: Recent Labs    12/16/18 1935 12/17/18 0605  12/18/18 0449  12/18/18 1424 12/18/18 2358 12/19/18 0757  HGB 12.2 12.0  --  12.0  --   --  10.4*  --   HCT 37.1 36.4  --  37.1  --   --  31.9*  --   PLT 328 293  --  241  --   --  215  --   HEPARINUNFRC  --  0.31   < > 0.31   < > 0.22* 0.25* 0.32  CREATININE 1.05* 1.09*  --  1.41*  --   --   --   --    < > = values in this interval not displayed.    Estimated Creatinine Clearance: 36.2 mL/min (A) (by C-G formula based on SCr of 1.41 mg/dL (H)).   Assessment: 50 YOF presenting with abdominal/back pain with N/V found to have occlusive thrombus in left renal artery and pharmacy consulted to start heparin.  Heparin level came back therapeutic at 0.32, on 1500 units/hr. Hgb 10.4, plt 215 - monitor closely. No s/sx of bleeding or infusion issues - confirmed with RN.   Goal of Therapy:  Heparin level 0.3-0.7 units/ml Monitor platelets by anticoagulation protocol: Yes   Plan:  -Continue IV heparin at 1500 units/hr -Check 6 hour confirmatory heparin level -IV heparin to stop 5/26 at 0130 per TCTS for bronchoscopy on 5/26 (noted hold heparin 6hr preop and 12 hours postop)  [name], PharmD PGY1 Pharmacy Resident Phone [phone] 12/19/2018       8:55 AM

## 2018-12-19 NOTE — Progress Notes (Signed)
ANTICOAGULATION CONSULT NOTE - Follow-Up Consult  Pharmacy Consult for Heparin Indication: Renal infarct/thrombus  Patient Measurements: Height: 5\' 4"  (162.6 cm) Weight: 137 lb 5.6 oz (62.3 kg) IBW/kg (Calculated) : 54.7 Heparin Dosing Weight: 61.2 kg  Vital Signs: Temp: 99.1 F (37.3 C) (05/24 0517) Temp Source: Oral (05/24 0517) BP: 120/75 (05/24 0517) Pulse Rate: 93 (05/24 0517)  Labs: Recent Labs    12/17/18 0605  12/18/18 0449  12/18/18 2358 12/19/18 0757 12/19/18 1414  HGB 12.0  --  12.0  --  10.4*  --   --   HCT 36.4  --  37.1  --  31.9*  --   --   PLT 293  --  241  --  215  --   --   HEPARINUNFRC 0.31   < > 0.31   < > 0.25* 0.32 0.31  CREATININE 1.09*  --  1.41*  --   --  1.15*  --    < > = values in this interval not displayed.    Estimated Creatinine Clearance: 44.4 mL/min (A) (by C-G formula based on SCr of 1.15 mg/dL (H)).   Assessment: Yolanda Miller presenting with abdominal/back pain with N/V found to have occlusive thrombus in left renal artery and pharmacy consulted to start heparin.  Repeat heparin level this afternoon remains therapeutic, no changes needed.   Goal of Therapy:  Heparin level 0.3-0.7 units/ml Monitor platelets by anticoagulation protocol: Yes   Plan:  -Continue IV heparin at 1500 units/hr -Daily heparin level and CBC -IV heparin to stop 5/26 at 0130 per TCTS for bronchoscopy on 5/26 (noted hold heparin 6hr preop and 12 hours postop)   Arrie Senate, PharmD, BCPS Clinical Pharmacist [phone] Please check AMION for all [place] numbers 12/19/2018

## 2018-12-20 LAB — BASIC METABOLIC PANEL
Anion gap: 9 (ref 5–15)
BUN: 7 mg/dL — ABNORMAL LOW (ref 8–23)
CO2: 24 mmol/L (ref 22–32)
Calcium: 8 mg/dL — ABNORMAL LOW (ref 8.9–10.3)
Chloride: 107 mmol/L (ref 98–111)
Creatinine, Ser: 1.06 mg/dL — ABNORMAL HIGH (ref 0.44–1.00)
GFR calc Af Amer: >60 mL/min (ref >60)
GFR calc non Af Amer: 57 mL/min — ABNORMAL LOW (ref >60)
Glucose, Bld: 116 mg/dL — ABNORMAL HIGH (ref 70–99)
Potassium: 3.9 mmol/L (ref 3.5–5.1)
Sodium: 140 mmol/L (ref 135–145)

## 2018-12-20 LAB — HEPARIN LEVEL (UNFRACTIONATED)
Heparin Unfractionated: 0.21 IU/mL — ABNORMAL LOW (ref 0.30–0.70)
Heparin Unfractionated: 0.49 IU/mL (ref 0.30–0.70)

## 2018-12-20 LAB — CBC
HCT: 29.9 % — ABNORMAL LOW (ref 36.0–46.0)
Hemoglobin: 9.7 g/dL — ABNORMAL LOW (ref 12.0–15.0)
MCH: 29.1 pg (ref 26.0–34.0)
MCHC: 32.4 g/dL (ref 30.0–36.0)
MCV: 89.8 fL (ref 80.0–100.0)
Platelets: 218 10*3/uL (ref 150–400)
RBC: 3.33 MIL/uL — ABNORMAL LOW (ref 3.87–5.11)
RDW: 12.4 % (ref 11.5–15.5)
WBC: 10 10*3/uL (ref 4.0–10.5)
nRBC: 0 % (ref 0.0–0.2)

## 2018-12-20 NOTE — Progress Notes (Addendum)
ANTICOAGULATION CONSULT NOTE - Follow-Up Consult  Pharmacy Consult for Heparin Indication: Renal infarct/thrombus  Patient Measurements: Height: 5\' 4"  (162.6 cm) Weight: 138 lb 7.2 oz (62.8 kg) IBW/kg (Calculated) : 54.7 Heparin Dosing Weight: 61.2 kg  Vital Signs: Temp: 98 F (36.7 C) (05/25 0548) Temp Source: Oral (05/25 0548) BP: 127/75 (05/25 0915) Pulse Rate: 76 (05/25 0915)  Labs: Recent Labs    12/18/18 0449  12/18/18 2358 12/19/18 0757 12/19/18 1414 12/20/18 0424 12/20/18 0909  HGB 12.0  --  10.4*  --   --  9.7*  --   HCT 37.1  --  31.9*  --   --  29.9*  --   PLT 241  --  215  --   --  218  --   HEPARINUNFRC 0.31   < > 0.25* 0.32 0.31  --  0.21*  CREATININE 1.41*  --   --  1.15*  --  1.06*  --    < > = values in this interval not displayed.    Estimated Creatinine Clearance: 48.1 mL/min (A) (by C-G formula based on SCr of 1.06 mg/dL (H)).   Assessment: 54 YOF presenting with abdominal/back pain with N/V found to have occlusive thrombus in left renal artery and pharmacy consulted to start heparin.  Heparin level this morning sub-therapeutic at 0.21. Hgb 10.4>9.7, pltc wnl. No infusion issues or bleeding per RN.  Goal of Therapy:   Heparin level 0.3-0.7 units/ml Monitor platelets by anticoagulation protocol: Yes   Plan:  -Increase IV heparin to 1650 units/hr -Recheck heparin level in 8 hours -Daily heparin level and CBC -IV heparin to stop 5/26 at 0130 per TCTS for bronchoscopy on 5/26 (noted hold heparin 6hr preop and 12 hours postop)  Yolanda Miller, PharmD PGY1 Pharmacy Resident Phone [phone] 12/20/2018       10:30 AM

## 2018-12-20 NOTE — TOC Benefit Eligibility Note (Signed)
Transition of Care [facility]) Benefit Eligibility Note    Patient Details  Name: Yolanda Miller MRN: [id number] Date of Birth: 09-Nov-1955   Medication/Dose: Alveda Reasons   20MG  DAILY   Covered?: Yes     Prescription Coverage [facility]erred [facility])  Spoke with Person/Company/Phone Number:: [employer]([facility] RX # [phone] OPT-MEMBER)  Co-Pay: [employer]  Prior Approval: No     Additional Notes: ELIQUIS  5 MG BID(COVER- YES, CO-PAY- -ZERO DOLLARS , TIER-NO, P/A-NO)    Memory Argue Phone Number: 12/20/2018, 9:57 AM

## 2018-12-20 NOTE — Anesthesia Preprocedure Evaluation (Addendum)
Anesthesia Evaluation  Patient identified by MRN, date of birth, ID band Patient awake    Reviewed: Allergy & Precautions, NPO status , Patient's Chart, lab work & pertinent test results  History of Anesthesia Complications Negative for: history of anesthetic complications  Airway Mallampati: I  TM Distance: >3 FB Neck ROM: Full    Dental  (+) Teeth Intact, Dental Advisory Given   Pulmonary shortness of breath, COPD,  COPD inhaler, former smoker (recently quit),  L Lung mass with iliac and peritoneal mets 12/15/2018 SARS coronavirus NEG   breath sounds clear to auscultation       Cardiovascular hypertension, (-) angina+ DVT (renal )   Rhythm:Regular Rate:Normal  12/17/2018 ECHO: EF 60-65%, mod pericardial effusion, valves OK   Neuro/Psych  Headaches, Anxiety Depression    GI/Hepatic Neg liver ROS, hiatal hernia, neg GERD  ,  Endo/Other  Hypothyroidism   Renal/GU Renal InsufficiencyRenal disease     Musculoskeletal   Abdominal   Peds  Hematology  (+) Blood dyscrasia (Hb 9.7), anemia , xarelto   Anesthesia Other Findings   Reproductive/Obstetrics                            Anesthesia Physical Anesthesia Plan  ASA: III  Anesthesia Plan: General   Post-op Pain Management:    Induction: Intravenous  PONV Risk Score and Plan: 3 and Ondansetron and Dexamethasone  Airway Management Planned: Oral ETT  Additional Equipment:   Intra-op Plan:   Post-operative Plan: Extubation in OR  Informed Consent: I have reviewed the patients History and Physical, chart, labs and discussed the procedure including the risks, benefits and alternatives for the proposed anesthesia with the patient or authorized representative who has indicated his/her understanding and acceptance.     Dental advisory given  Plan Discussed with: CRNA and Surgeon  Anesthesia Plan Comments:       Anesthesia Quick  Evaluation

## 2018-12-20 NOTE — Progress Notes (Signed)
Progress Note    Yolanda Miller  SJG:[id number] DOB: 1955-12-03  DOA: 12/16/2018 PCP: [facility]    Brief Narrative:     Medical records reviewed and are as summarized below:  Yolanda Miller is an 63 y.o. female with medical history significant for hypothyroidism and metastatic cancer currently being worked up, and now presenting to the emergency department for evaluation of severe left flank and lower quadrant abdomen pain.  She reports developing severe pain in the left flank and lower abdomen this morning and has had some nausea with a couple episodes of nonbloody vomiting associated with this.  She was initially attributing this to constipation as she had not moved her bowels in a couple days, but the pain was becoming more severe and so she came in for evaluation.  She denies any chest pain or palpitations, denies any pain, swelling, or discoloration of the lower extremities, and denies any recent fevers or chills.  She has a mild nonproductive cough that has not changed much in recent weeks.  Patient has been following with oncology for a lung mass concerning for small cell lung cancer and with metastatic right iliac lesion and peritoneal nodule, was scheduled with CT surgery for bronchoscopy and biopsy on 12/17/2018.  Assessment/Plan:   Principal Problem:   Renal infarction [facility]) Active Problems:   Hypothyroidism   Mass of upper lobe of left lung   Renal arterial thrombosis (HCC)   Cancer-related pain   Mild renal insufficiency   Renal infarct ([place])  Renal artery thrombosis with infarction  - Presents with one day of pain in left flank and LLQ, and is found to have diffuse infarction of left kidney, left renal artery thrombosis, and likely right renal infarctions  - She is in sinus rhythm in ED, denies palpitations, and no evidence for other sites of thrombosis identified - tele for last few days unrevealing, will d/c - Etiology could be  hypercoagulability of malignancy - She has been started on IV heparin infusion in ED  - Continue anticoagulation -change to eliquis after procedure   Metastatic cancer  - Following with oncology for left lung mass with right iliac and peritoneal mets  - She had been scheduled for bronchoscopy with biopsy 12/17/18  -- this has been rescheduled to Tuesday -patient has been scheduled for radiation in [place] on Tuesday -discussed with orthopedics the iliac metatasis-- this would need oncology orthopedic that is not available at [facility]-- would need [facility] -patient uses crutches- has an outpatient appointment arranged  AKI - appears dry -continue IVF as she is still not taking in much PO -Cr improved  leukocyotsis -trending down  Hypothyroidism  - Continue Synthroid    Family Communication/Anticipated D/C date and plan/Code Status   DVT prophylaxis: heparin gtt Code Status: Full Code.  Family Communication:  Disposition Plan: after bronch-- will need to  Be changed over to NOAC-- preferably eliquis   Medical Consultants:    cvts    Subjective:   Says appetite is slightly better  Objective:    Vitals:   12/19/18 0517 12/19/18 1753 12/19/18 2123 12/20/18 0548  BP: 120/75 126/73 128/69 124/71  Pulse: 93 87 82 78  Resp: 19 16 20 20   Temp: 99.1 F (37.3 C) 99.1 F (37.3 C) 99.5 F (37.5 C) 98 F (36.7 C)  TempSrc: Oral Oral Oral Oral  SpO2: 94% 97% 99% 97%  Weight:   62.8 kg   Height:  Intake/Output Summary (Last 24 hours) at 12/20/2018 0909 Last data filed at 12/20/2018 0700 Gross per 24 hour  Intake 1909.39 ml  Output 1850 ml  Net 59.39 ml   Filed Weights   12/17/18 2123 12/18/18 1938 12/19/18 2123  Weight: 61.1 kg 62.3 kg 62.8 kg    Exam: A+Ox3 Frail appearing rrr No increased work of breathing No LE edema   Data Reviewed:   I have personally reviewed following labs and imaging studies:  Labs: Labs show the following:   Basic  Metabolic Panel: Recent Labs  Lab 12/16/18 1935 12/17/18 0605 12/18/18 0449 12/19/18 0757 12/20/18 0424  NA 136 138 135 139 140  K 4.2 5.0 4.6 4.1 3.9  CL 101 99 98 102 107  CO2 23 25 23 25 24   GLUCOSE 142* 122* 134* 146* 116*  BUN 13 14 11 9  7*  CREATININE 1.05* 1.09* 1.41* 1.15* 1.06*  CALCIUM 9.7 9.3 9.0 8.6* 8.0*   GFR Estimated Creatinine Clearance: 48.1 mL/min (A) (by C-G formula based on SCr of 1.06 mg/dL (H)). Liver Function Tests: Recent Labs  Lab 12/15/18 0942 12/16/18 1935  AST 15 27  ALT 14 21  ALKPHOS 72 76  BILITOT 0.5 0.6  PROT 7.6 7.5  ALBUMIN 4.2 4.1   Recent Labs  Lab 12/16/18 1935  LIPASE 21   No results for input(s): AMMONIA in the last 168 hours. Coagulation profile Recent Labs  Lab 12/15/18 0942  INR 1.1    CBC: Recent Labs  Lab 12/16/18 1935 12/17/18 0605 12/18/18 0449 12/18/18 2358 12/20/18 0424  WBC 16.5* 19.6* 18.7* 14.7* 10.0  HGB 12.2 12.0 12.0 10.4* 9.7*  HCT 37.1 36.4 37.1 31.9* 29.9*  MCV 89.2 88.8 89.2 88.9 89.8  PLT 328 293 241 215 218   Cardiac Enzymes: No results for input(s): CKTOTAL, CKMB, CKMBINDEX, TROPONINI in the last 168 hours. BNP (last 3 results) No results for input(s): PROBNP in the last 8760 hours. CBG: Recent Labs  Lab 12/17/18 0744 12/18/18 0650 12/19/18 0720  GLUCAP 107* 118* 130*   D-Dimer: No results for input(s): DDIMER in the last 72 hours. Hgb A1c: No results for input(s): HGBA1C in the last 72 hours. Lipid Profile: No results for input(s): CHOL, HDL, LDLCALC, TRIG, CHOLHDL, LDLDIRECT in the last 72 hours. Thyroid function studies: No results for input(s): TSH, T4TOTAL, T3FREE, THYROIDAB in the last 72 hours.  Invalid input(s): FREET3 Anemia work up: No results for input(s): VITAMINB12, FOLATE, FERRITIN, TIBC, IRON, RETICCTPCT in the last 72 hours. Sepsis Labs: Recent Labs  Lab 12/17/18 0605 12/18/18 0449 12/18/18 2358 12/20/18 0424  WBC 19.6* 18.7* 14.7* 10.0     Microbiology Recent Results (from the past 240 hour(s))  Surgical pcr screen     Status: None   Collection Time: 12/15/18  9:42 AM  Result Value Ref Range Status   MRSA, PCR NEGATIVE NEGATIVE Final   Staphylococcus aureus NEGATIVE NEGATIVE Final    Comment: (NOTE) The Xpert SA Assay (FDA approved for NASAL specimens in patients 26 years of age and older), is one component of a comprehensive surveillance program. It is not intended to diagnose infection nor to guide or monitor treatment. Performed at [facility], [place] [address], [place], [place] [address]   Novel Coronavirus, NAA (hospital order; send-out to ref lab)     Status: None   Collection Time: 12/15/18 10:13 AM  Result Value Ref Range Status   SARS-CoV-2, NAA NOT DETECTED NOT DETECTED Final    Comment: (  NOTE) Testing was performed using the cobas(R) SARS-CoV-2 test. This test was developed and its performance characteristics determined by [employer]. This test has not been FDA cleared or approved. This test has been authorized by FDA under an Emergency Use Authorization (EUA). This test is only authorized for the duration of time the declaration that circumstances exist justifying the authorization of the emergency use of in vitro diagnostic tests for detection of SARS-CoV-2 virus and/or diagnosis of COVID-19 infection under section 564(b)(1) of the Act, 21 U.S.C. 378HYI-5(O)(2), unless the authorization is terminated or revoked sooner. When diagnostic testing is negative, the possibility of a false negative result should be considered in the context of a patient's recent exposures and the presence of clinical signs and symptoms consistent with COVID-19. An individual without symptoms of COVID-19 and who is not shedding SARS-CoV-2 virus would expect to have  a negative (not detected) result in this assay. Performed At: [facility] [address] [place], [place] [id number] Rush Farmer MD VE:[id number]    [place]  Final    Comment: Performed at [facility], [place] [address], [place], [place] [address]    Procedures and diagnostic studies:  No results found.  Medications:   . feeding supplement  1 Container Oral TID WC  . feeding supplement (ENSURE ENLIVE)  237 mL Oral Daily  . levothyroxine  100 mcg Oral QAC breakfast  . multivitamin with minerals  1 tablet Oral Daily  . sodium chloride flush  3 mL Intravenous Q12H   Continuous Infusions: . sodium chloride 100 mL/hr at 12/20/18 0513  . heparin 1,500 Units/hr (12/20/18 0108)     LOS: 3 days   Geradine Girt  [facility]   How to contact the [facility] Attending or Consulting provider [place] or covering provider during after hours [place], for this patient?  1. Check the care team in [facility] and look for a) attending/consulting TRH provider listed and b) the [facility] team listed 2. Log into [email or web address] and use [place]'s universal password to access. If you do not have the password, please contact the hospital operator. 3. Locate the [facility] provider you are looking for under [facility] and page to a number that you can be directly reached. 4. If you still have difficulty reaching the provider, please page the [facility] (Director on Call) for the Hospitalists listed on amion for assistance.  12/20/2018, 9:09 AM

## 2018-12-20 NOTE — Progress Notes (Signed)
ANTICOAGULATION CONSULT NOTE - Follow-Up Consult  Pharmacy Consult for Heparin Indication: Renal infarct/thrombus  Patient Measurements: Height: 5\' 4"  (162.6 cm) Weight: 138 lb 7.2 oz (62.8 kg) IBW/kg (Calculated) : 54.7 Heparin Dosing Weight: 61.2 kg  Vital Signs: Temp: 98.3 F (36.8 C) (05/25 1748) Temp Source: Oral (05/25 1748) BP: 139/69 (05/25 1748) Pulse Rate: 78 (05/25 1748)  Labs: Recent Labs    12/18/18 0449  12/18/18 2358 12/19/18 0757 12/19/18 1414 12/20/18 0424 12/20/18 0909 12/20/18 1922  HGB 12.0  --  10.4*  --   --  9.7*  --   --   HCT 37.1  --  31.9*  --   --  29.9*  --   --   PLT 241  --  215  --   --  218  --   --   HEPARINUNFRC 0.31   < > 0.25* 0.32 0.31  --  0.21* 0.49  CREATININE 1.41*  --   --  1.15*  --  1.06*  --   --    < > = values in this interval not displayed.    Estimated Creatinine Clearance: 48.1 mL/min (A) (by C-G formula based on SCr of 1.06 mg/dL (H)).   Assessment: 40 YOF presenting with abdominal/back pain with N/V found to have occlusive thrombus in left renal artery and pharmacy consulted to start heparin.  Heparin level this evening therapeutic at 0.49. Hgb 10.4>9.7, pltc wnl. No infusion issues or bleeding per RN.  Goal of Therapy:   Heparin level 0.3-0.7 units/ml Monitor platelets by anticoagulation protocol: Yes   Plan:  -Continue IV heparin at 1650 units/hr -Daily heparin level and CBC -IV heparin to stop 5/26 at 0130 per TCTS for bronchoscopy on 5/26 (noted hold heparin 6hr preop and 12 hours postop)  Erin Hearing PharmD., BCPS Clinical Pharmacist 12/20/2018 7:59 PM

## 2018-12-21 ENCOUNTER — Inpatient Hospital Stay (HOSPITAL_COMMUNITY): Payer: BC Managed Care – PPO

## 2018-12-21 ENCOUNTER — Inpatient Hospital Stay (HOSPITAL_COMMUNITY): Payer: BC Managed Care – PPO | Admitting: Anesthesiology

## 2018-12-21 ENCOUNTER — Encounter (HOSPITAL_COMMUNITY): Payer: Self-pay | Admitting: Certified Registered Nurse Anesthetist

## 2018-12-21 ENCOUNTER — Encounter (HOSPITAL_COMMUNITY)
Admission: EM | Disposition: A | Discharge status: Home / Self Care | Payer: Self-pay | Source: Home / Self Care | Attending: Internal Medicine

## 2018-12-21 DIAGNOSIS — C3412 Malignant neoplasm of upper lobe, left bronchus or lung: Secondary | ICD-10-CM

## 2018-12-21 DIAGNOSIS — C3490 Malignant neoplasm of unspecified part of unspecified bronchus or lung: Secondary | ICD-10-CM

## 2018-12-21 HISTORY — PX: VIDEO BRONCHOSCOPY WITH ENDOBRONCHIAL ULTRASOUND: SHX6177

## 2018-12-21 LAB — COMPREHENSIVE METABOLIC PANEL
ALT: 37 U/L (ref 0–44)
AST: 14 U/L — ABNORMAL LOW (ref 15–41)
Albumin: 2.4 g/dL — ABNORMAL LOW (ref 3.5–5.0)
Alkaline Phosphatase: 78 U/L (ref 38–126)
Anion gap: 7 (ref 5–15)
BUN: 5 mg/dL — ABNORMAL LOW (ref 8–23)
CO2: 22 mmol/L (ref 22–32)
Calcium: 8 mg/dL — ABNORMAL LOW (ref 8.9–10.3)
Chloride: 111 mmol/L (ref 98–111)
Creatinine, Ser: 0.97 mg/dL (ref 0.44–1.00)
GFR calc Af Amer: >60 mL/min (ref >60)
GFR calc non Af Amer: >60 mL/min (ref >60)
Glucose, Bld: 102 mg/dL — ABNORMAL HIGH (ref 70–99)
Potassium: 3.6 mmol/L (ref 3.5–5.1)
Sodium: 140 mmol/L (ref 135–145)
Total Bilirubin: 0.4 mg/dL (ref 0.3–1.2)
Total Protein: 5.5 g/dL — ABNORMAL LOW (ref 6.5–8.1)

## 2018-12-21 LAB — CBC
HCT: 27.9 % — ABNORMAL LOW (ref 36.0–46.0)
Hemoglobin: 9 g/dL — ABNORMAL LOW (ref 12.0–15.0)
MCH: 28.9 pg (ref 26.0–34.0)
MCHC: 32.3 g/dL (ref 30.0–36.0)
MCV: 89.7 fL (ref 80.0–100.0)
Platelets: 229 10*3/uL (ref 150–400)
RBC: 3.11 MIL/uL — ABNORMAL LOW (ref 3.87–5.11)
RDW: 12.5 % (ref 11.5–15.5)
WBC: 9.5 10*3/uL (ref 4.0–10.5)
nRBC: 0 % (ref 0.0–0.2)

## 2018-12-21 SURGERY — BRONCHOSCOPY, WITH EBUS
Anesthesia: General

## 2018-12-21 MED ORDER — EPINEPHRINE PF 1 MG/ML IJ SOLN
INTRAMUSCULAR | Status: AC
  Filled 2018-12-21: qty 1

## 2018-12-21 MED ORDER — LIDOCAINE 2% (20 MG/ML) 5 ML SYRINGE
INTRAMUSCULAR | Status: AC
  Filled 2018-12-21: qty 5

## 2018-12-21 MED ORDER — ALBUTEROL SULFATE (2.5 MG/3ML) 0.083% IN NEBU
2.5000 mg | INHALATION_SOLUTION | RESPIRATORY_TRACT | Status: DC | PRN
  Administered 2018-12-21: 2.5 mg via RESPIRATORY_TRACT

## 2018-12-21 MED ORDER — DEXAMETHASONE SODIUM PHOSPHATE 10 MG/ML IJ SOLN
INTRAMUSCULAR | Status: DC | PRN
  Administered 2018-12-21: 10 mg via INTRAVENOUS

## 2018-12-21 MED ORDER — LACTATED RINGERS IV SOLN
INTRAVENOUS | Status: DC | PRN
  Administered 2018-12-21: 07:00:00 via INTRAVENOUS

## 2018-12-21 MED ORDER — ROCURONIUM BROMIDE 10 MG/ML (PF) SYRINGE
PREFILLED_SYRINGE | INTRAVENOUS | Status: AC
  Filled 2018-12-21: qty 10

## 2018-12-21 MED ORDER — PROMETHAZINE HCL 25 MG/ML IJ SOLN: 6.2500 mg | INTRAMUSCULAR | Status: DC | PRN

## 2018-12-21 MED ORDER — PROPOFOL 10 MG/ML IV BOLUS
INTRAVENOUS | Status: DC | PRN
  Administered 2018-12-21: 150 mg via INTRAVENOUS

## 2018-12-21 MED ORDER — ROCURONIUM BROMIDE 100 MG/10ML IV SOLN
INTRAVENOUS | Status: DC | PRN
  Administered 2018-12-21: 50 mg via INTRAVENOUS

## 2018-12-21 MED ORDER — FUROSEMIDE 10 MG/ML IJ SOLN
20.0000 mg | Freq: Once | INTRAMUSCULAR | Status: AC
  Administered 2018-12-21: 20 mg via INTRAVENOUS
  Filled 2018-12-21: qty 2

## 2018-12-21 MED ORDER — FENTANYL CITRATE (PF) 100 MCG/2ML IJ SOLN
INTRAMUSCULAR | Status: AC
  Filled 2018-12-21: qty 2

## 2018-12-21 MED ORDER — DEXAMETHASONE SODIUM PHOSPHATE 10 MG/ML IJ SOLN
INTRAMUSCULAR | Status: AC
  Filled 2018-12-21: qty 1

## 2018-12-21 MED ORDER — FENTANYL CITRATE (PF) 100 MCG/2ML IJ SOLN
INTRAMUSCULAR | Status: DC | PRN
  Administered 2018-12-21 (×4): 50 ug via INTRAVENOUS

## 2018-12-21 MED ORDER — HEPARIN (PORCINE) 25000 UT/250ML-% IV SOLN: 1650.0000 [IU]/h | INTRAVENOUS | Status: DC

## 2018-12-21 MED ORDER — 0.9 % SODIUM CHLORIDE (POUR BTL) OPTIME
TOPICAL | Status: DC | PRN
  Administered 2018-12-21: 1000 mL

## 2018-12-21 MED ORDER — SUCCINYLCHOLINE CHLORIDE 200 MG/10ML IV SOSY
PREFILLED_SYRINGE | INTRAVENOUS | Status: DC | PRN
  Administered 2018-12-21: 140 mg via INTRAVENOUS

## 2018-12-21 MED ORDER — PROPOFOL 10 MG/ML IV BOLUS
INTRAVENOUS | Status: AC
  Filled 2018-12-21: qty 20

## 2018-12-21 MED ORDER — MAGNESIUM SULFATE IN D5W 1-5 GM/100ML-% IV SOLN
1.0000 g | Freq: Once | INTRAVENOUS | Status: AC
  Administered 2018-12-21: 1 g via INTRAVENOUS
  Filled 2018-12-21: qty 100

## 2018-12-21 MED ORDER — IPRATROPIUM-ALBUTEROL 0.5-2.5 (3) MG/3ML IN SOLN
3.0000 mL | Freq: Four times a day (QID) | RESPIRATORY_TRACT | Status: DC
  Administered 2018-12-21 – 2018-12-23 (×7): 3 mL via RESPIRATORY_TRACT
  Filled 2018-12-21 (×7): qty 3

## 2018-12-21 MED ORDER — EPINEPHRINE PF 1 MG/ML IJ SOLN
INTRAMUSCULAR | Status: DC | PRN
  Administered 2018-12-21: 1 mg

## 2018-12-21 MED ORDER — FENTANYL CITRATE (PF) 100 MCG/2ML IJ SOLN
25.0000 ug | INTRAMUSCULAR | Status: DC | PRN
  Administered 2018-12-21: 25 ug via INTRAVENOUS

## 2018-12-21 MED ORDER — ONDANSETRON HCL 4 MG/2ML IJ SOLN
INTRAMUSCULAR | Status: DC | PRN
  Administered 2018-12-21: 4 mg via INTRAVENOUS

## 2018-12-21 MED ORDER — ONDANSETRON HCL 4 MG/2ML IJ SOLN
INTRAMUSCULAR | Status: AC
  Filled 2018-12-21: qty 14

## 2018-12-21 MED ORDER — LIDOCAINE 2% (20 MG/ML) 5 ML SYRINGE
INTRAMUSCULAR | Status: DC | PRN
  Administered 2018-12-21: 60 mg via INTRAVENOUS

## 2018-12-21 MED ORDER — FENTANYL CITRATE (PF) 250 MCG/5ML IJ SOLN
INTRAMUSCULAR | Status: AC
  Filled 2018-12-21: qty 5

## 2018-12-21 MED ORDER — METHYLPREDNISOLONE SODIUM SUCC 125 MG IJ SOLR
60.0000 mg | Freq: Once | INTRAMUSCULAR | Status: AC
  Administered 2018-12-21: 60 mg via INTRAVENOUS
  Filled 2018-12-21: qty 2

## 2018-12-21 MED ORDER — ALBUTEROL SULFATE (2.5 MG/3ML) 0.083% IN NEBU
INHALATION_SOLUTION | RESPIRATORY_TRACT | Status: AC
  Filled 2018-12-21: qty 3

## 2018-12-21 MED ORDER — MIDAZOLAM HCL 2 MG/2ML IJ SOLN
INTRAMUSCULAR | Status: AC
  Filled 2018-12-21: qty 2

## 2018-12-21 MED ORDER — MIDAZOLAM HCL 2 MG/2ML IJ SOLN: 0.5000 mg | Freq: Once | INTRAMUSCULAR | Status: DC | PRN

## 2018-12-21 MED ORDER — CEFAZOLIN SODIUM 1 G IJ SOLR
INTRAMUSCULAR | Status: AC
  Filled 2018-12-21: qty 20

## 2018-12-21 MED ORDER — ALBUTEROL SULFATE (2.5 MG/3ML) 0.083% IN NEBU
2.5000 mg | INHALATION_SOLUTION | Freq: Once | RESPIRATORY_TRACT | Status: AC
  Administered 2018-12-21: 2.5 mg via RESPIRATORY_TRACT

## 2018-12-21 MED ORDER — CEFAZOLIN SODIUM-DEXTROSE 2-3 GM-%(50ML) IV SOLR
INTRAVENOUS | Status: DC | PRN
  Administered 2018-12-21: 2 g via INTRAVENOUS

## 2018-12-21 MED ORDER — MIDAZOLAM HCL 5 MG/5ML IJ SOLN
INTRAMUSCULAR | Status: DC | PRN
  Administered 2018-12-21: 2 mg via INTRAVENOUS

## 2018-12-21 MED ORDER — SUCCINYLCHOLINE CHLORIDE 200 MG/10ML IV SOSY
PREFILLED_SYRINGE | INTRAVENOUS | Status: AC
  Filled 2018-12-21: qty 10

## 2018-12-21 MED ORDER — SUGAMMADEX SODIUM 200 MG/2ML IV SOLN
INTRAVENOUS | Status: DC | PRN
  Administered 2018-12-21: 150 mg via INTRAVENOUS

## 2018-12-21 SURGICAL SUPPLY — 67 items
ADAPTER VALVE BIOPSY EBUS (MISCELLANEOUS) IMPLANT
ADPTR VALVE BIOPSY EBUS (MISCELLANEOUS)
APPLIER CLIP LOGIC TI 5 (MISCELLANEOUS) IMPLANT
BLADE SURG 15 STRL LF DISP TIS (BLADE) ×1 IMPLANT
BLADE SURG 15 STRL SS (BLADE)
BRUSH CYTOL CELLEBRITY 1.5X140 (MISCELLANEOUS) ×4 IMPLANT
CANISTER SUCT 3000ML PPV (MISCELLANEOUS) ×4 IMPLANT
CLIP VESOCCLUDE MED 6/CT (CLIP) ×1 IMPLANT
CONT SPEC 4OZ CLIKSEAL STRL BL (MISCELLANEOUS) ×9 IMPLANT
COVER BACK TABLE 60X90IN (DRAPES) ×3 IMPLANT
COVER SURGICAL LIGHT HANDLE (MISCELLANEOUS) ×4 IMPLANT
COVER WAND RF STERILE (DRAPES) ×1 IMPLANT
DERMABOND ADVANCED (GAUZE/BANDAGES/DRESSINGS)
DERMABOND ADVANCED .7 DNX12 (GAUZE/BANDAGES/DRESSINGS) ×1 IMPLANT
DRAPE CHEST BREAST 15X10 FENES (DRAPES) ×1 IMPLANT
ELECT REM PT RETURN 9FT ADLT (ELECTROSURGICAL)
ELECTRODE REM PT RTRN 9FT ADLT (ELECTROSURGICAL) ×1 IMPLANT
FILTER STRAW FLUID ASPIR (MISCELLANEOUS) ×2 IMPLANT
FORCEPS BIOP RJ4 1.8 (CUTTING FORCEPS) ×2 IMPLANT
FORCEPS RADIAL JAW LRG 4 PULM (INSTRUMENTS) IMPLANT
GAUZE 4X4 16PLY RFD (DISPOSABLE) ×1 IMPLANT
GAUZE SPONGE 4X4 12PLY STRL (GAUZE/BANDAGES/DRESSINGS) ×2 IMPLANT
GLOVE SURG SIGNA 7.5 PF LTX (GLOVE) ×4 IMPLANT
GOWN STRL REUS W/ TWL LRG LVL3 (GOWN DISPOSABLE) ×1 IMPLANT
GOWN STRL REUS W/ TWL XL LVL3 (GOWN DISPOSABLE) ×2 IMPLANT
GOWN STRL REUS W/TWL LRG LVL3 (GOWN DISPOSABLE) ×4
GOWN STRL REUS W/TWL XL LVL3 (GOWN DISPOSABLE) ×2
HEMOSTAT SURGICEL 2X14 (HEMOSTASIS) IMPLANT
KIT BASIN OR (CUSTOM PROCEDURE TRAY) ×3 IMPLANT
KIT CLEAN ENDO COMPLIANCE (KITS) ×6 IMPLANT
KIT TURNOVER KIT B (KITS) ×4 IMPLANT
MARKER SKIN DUAL TIP RULER LAB (MISCELLANEOUS) ×3 IMPLANT
NDL 18GX1X1/2 (RX/OR ONLY) (NEEDLE) IMPLANT
NDL ASPIRATION VIZISHOT 19G (NEEDLE) IMPLANT
NDL ASPIRATION VIZISHOT 21G (NEEDLE) ×1 IMPLANT
NDL BLUNT 18X1 FOR OR ONLY (NEEDLE) IMPLANT
NEEDLE 18GX1X1/2 (RX/OR ONLY) (NEEDLE) ×3 IMPLANT
NEEDLE ASPIRATION VIZISHOT 19G (NEEDLE) ×3 IMPLANT
NEEDLE ASPIRATION VIZISHOT 21G (NEEDLE) IMPLANT
NEEDLE BLUNT 18X1 FOR OR ONLY (NEEDLE) IMPLANT
NS IRRIG 1000ML POUR BTL (IV SOLUTION) ×4 IMPLANT
OIL SILICONE PENTAX (PARTS (SERVICE/REPAIRS)) ×3 IMPLANT
PACK GENERAL/GYN (CUSTOM PROCEDURE TRAY) ×3 IMPLANT
PAD ARMBOARD 7.5X6 YLW CONV (MISCELLANEOUS) ×8 IMPLANT
RADIAL JAW LRG 4 PULMONARY (INSTRUMENTS)
SPONGE INTESTINAL PEANUT (DISPOSABLE) IMPLANT
SUT SILK 2 0 (SUTURE)
SUT SILK 2-0 18XBRD TIE 12 (SUTURE) IMPLANT
SUT VIC AB 2-0 CT1 27 (SUTURE)
SUT VIC AB 2-0 CT1 TAPERPNT 27 (SUTURE) IMPLANT
SUT VIC AB 3-0 SH 18 (SUTURE) ×1 IMPLANT
SUT VICRYL 4-0 PS2 18IN ABS (SUTURE) ×1 IMPLANT
SYR 10ML LL (SYRINGE) ×1 IMPLANT
SYR 20CC LL (SYRINGE) ×3 IMPLANT
SYR 20ML ECCENTRIC (SYRINGE) ×3 IMPLANT
SYR 3ML LL SCALE MARK (SYRINGE) ×2 IMPLANT
SYR 5ML LL (SYRINGE) ×1 IMPLANT
SYR 5ML LUER SLIP (SYRINGE) ×1 IMPLANT
TOWEL GREEN STERILE (TOWEL DISPOSABLE) ×6 IMPLANT
TOWEL GREEN STERILE FF (TOWEL DISPOSABLE) ×6 IMPLANT
TRAP SPECIMEN MUCOUS 40CC (MISCELLANEOUS) ×3 IMPLANT
TUBE CONNECTING 20'X1/4 (TUBING) ×1
TUBE CONNECTING 20X1/4 (TUBING) ×2 IMPLANT
VALVE BIOPSY  SINGLE USE (MISCELLANEOUS) ×2
VALVE BIOPSY SINGLE USE (MISCELLANEOUS) ×1 IMPLANT
VALVE SUCTION BRONCHIO DISP (MISCELLANEOUS) ×5 IMPLANT
WATER STERILE IRR 1000ML POUR (IV SOLUTION) ×4 IMPLANT

## 2018-12-21 NOTE — Progress Notes (Signed)
Progress Note    Yolanda Miller  OVF:[id number] DOB: 1956/03/20  DOA: 12/16/2018 PCP: [facility]    Brief Narrative:     Medical records reviewed and are as summarized below:  Yolanda Miller is an 63 y.o. female with medical history significant for hypothyroidism and metastatic cancer currently being worked up, and now presenting to the emergency department for evaluation of severe left flank and lower quadrant abdomen pain.  Found to have renal infarct and placed on heparin.  Heparin was continued over the weekend as she has a bronch by Dr. Roxan Hockey today.  Plan to transition over to PO NOAC when safe.  Assessment/Plan:   Principal Problem:   Renal infarction [facility]) Active Problems:   Hypothyroidism   Mass of upper lobe of left lung   Renal arterial thrombosis (HCC)   Cancer-related pain   Mild renal insufficiency   Renal infarct (HCC)  Renal artery thrombosis with infarction  - Presents with one day of pain in left flank and LLQ, and is found to have diffuse infarction of left kidney, left renal artery thrombosis, and likely right renal infarctions  - She is in sinus rhythm in ED, denies palpitations, and no evidence for other sites of thrombosis identified - tele for last few days unrevealing, will d/c - Etiology could be hypercoagulability of malignancy - She has been started on IV heparin infusion in ED-- heparin is being held for 24 hour post bronch/biopsy  -for discharge, plan to have her on eliquis  Metastatic cancer - non-small cell carcinoma stage IV - Following with oncology for left lung mass with right iliac and peritoneal mets  - She had been scheduled for bronchoscopy with biopsy 12/17/18  -done today with results of non small cell carcinoma stage IV -patient has been scheduled for radiation in [place] on Tuesday which will need to be reschedule once released from the hospital -discussed with orthopedics the iliac metatasis--  this would need oncology orthopedic that is not available at [facility]-- would need [facility] -patient uses crutches- has an outpatient appointment arranged  AKI - resolved after IVF -will hold fluids for now but may need resumption if not eating well  leukocyotsis -trending down  Anemia -probably chronic disease -monitor for bleeding  Hypothyroidism  - Continue Synthroid    Family Communication/Anticipated D/C date and plan/Code Status   DVT prophylaxis: heparin gtt Code Status: Full Code.  Family Communication:  Disposition Plan: after bronch-- will need to  Be changed over to NOAC-- preferably eliquis when stable   Medical Consultants:    cvts    Subjective:   Coarse breath sounds-- needs nebs  Objective:    Vitals:   12/21/18 0930 12/21/18 0949 12/21/18 0956 12/21/18 1029  BP:  (!) 148/73  (!) 147/71  Pulse:  (!) 108 (!) 101 79  Resp:  (!) 22 (!) 24 20  Temp: 97.9 F (36.6 C)     TempSrc:      SpO2:  95% 97% 96%  Weight:      Height:        Intake/Output Summary (Last 24 hours) at 12/21/2018 1043 Last data filed at 12/21/2018 0918 Gross per 24 hour  Intake 1440 ml  Output 810 ml  Net 630 ml   Filed Weights   12/18/18 1938 12/19/18 2123 12/20/18 2114  Weight: 62.3 kg 62.8 kg 63.4 kg    Exam: Uncomfortable appearing Coarse breath sounds b/l, on 3L [place], mild increased in  work of breathing rrr No LE edema   Data Reviewed:   I have personally reviewed following labs and imaging studies:  Labs: Labs show the following:   Basic Metabolic Panel: Recent Labs  Lab 12/17/18 0605 12/18/18 0449 12/19/18 0757 12/20/18 0424 12/21/18 0336  NA 138 135 139 140 140  K 5.0 4.6 4.1 3.9 3.6  CL 99 98 102 107 111  CO2 25 23 25 24 22   GLUCOSE 122* 134* 146* 116* 102*  BUN 14 11 9  7* 5*  CREATININE 1.09* 1.41* 1.15* 1.06* 0.97  CALCIUM 9.3 9.0 8.6* 8.0* 8.0*   GFR Estimated Creatinine Clearance: 52.6 mL/min (by C-G formula based on SCr of 0.97  mg/dL). Liver Function Tests: Recent Labs  Lab 12/15/18 0942 12/16/18 1935 12/21/18 0336  AST 15 27 14*  ALT 14 21 37  ALKPHOS 72 76 78  BILITOT 0.5 0.6 0.4  PROT 7.6 7.5 5.5*  ALBUMIN 4.2 4.1 2.4*   Recent Labs  Lab 12/16/18 1935  LIPASE 21   No results for input(s): AMMONIA in the last 168 hours. Coagulation profile Recent Labs  Lab 12/15/18 0942  INR 1.1    CBC: Recent Labs  Lab 12/17/18 0605 12/18/18 0449 12/18/18 2358 12/20/18 0424 12/21/18 0336  WBC 19.6* 18.7* 14.7* 10.0 9.5  HGB 12.0 12.0 10.4* 9.7* 9.0*  HCT 36.4 37.1 31.9* 29.9* 27.9*  MCV 88.8 89.2 88.9 89.8 89.7  PLT 293 241 215 218 229   Cardiac Enzymes: No results for input(s): CKTOTAL, CKMB, CKMBINDEX, TROPONINI in the last 168 hours. BNP (last 3 results) No results for input(s): PROBNP in the last 8760 hours. CBG: Recent Labs  Lab 12/17/18 0744 12/18/18 0650 12/19/18 0720  GLUCAP 107* 118* 130*   D-Dimer: No results for input(s): DDIMER in the last 72 hours. Hgb A1c: No results for input(s): HGBA1C in the last 72 hours. Lipid Profile: No results for input(s): CHOL, HDL, LDLCALC, TRIG, CHOLHDL, LDLDIRECT in the last 72 hours. Thyroid function studies: No results for input(s): TSH, T4TOTAL, T3FREE, THYROIDAB in the last 72 hours.  Invalid input(s): FREET3 Anemia work up: No results for input(s): VITAMINB12, FOLATE, FERRITIN, TIBC, IRON, RETICCTPCT in the last 72 hours. Sepsis Labs: Recent Labs  Lab 12/18/18 0449 12/18/18 2358 12/20/18 0424 12/21/18 0336  WBC 18.7* 14.7* 10.0 9.5    Microbiology Recent Results (from the past 240 hour(s))  Surgical pcr screen     Status: None   Collection Time: 12/15/18  9:42 AM  Result Value Ref Range Status   MRSA, PCR NEGATIVE NEGATIVE Final   Staphylococcus aureus NEGATIVE NEGATIVE Final    Comment: (NOTE) The Xpert SA Assay (FDA approved for NASAL specimens in patients 39 years of age and older), is one component of a comprehensive  surveillance program. It is not intended to diagnose infection nor to guide or monitor treatment. Performed at [facility], [place] [address], [place], [place] [address]   Novel Coronavirus, NAA (hospital order; send-out to ref lab)     Status: None   Collection Time: 12/15/18 10:13 AM  Result Value Ref Range Status   SARS-CoV-2, NAA NOT DETECTED NOT DETECTED Final    Comment: (NOTE) Testing was performed using the cobas(R) SARS-CoV-2 test. This test was developed and its performance characteristics determined by [employer]. This test has not been FDA cleared or approved. This test has been authorized by FDA under an Emergency Use Authorization (EUA). This test is only authorized for the duration of time  the declaration that circumstances exist justifying the authorization of the emergency use of in vitro diagnostic tests for detection of SARS-CoV-2 virus and/or diagnosis of COVID-19 infection under section 564(b)(1) of the Act, 21 U.S.C. 811BJY-7(W)(2), unless the authorization is terminated or revoked sooner. When diagnostic testing is negative, the possibility of a false negative result should be considered in the context of a patient's recent exposures and the presence of clinical signs and symptoms consistent with COVID-19. An individual without symptoms of COVID-19 and who is not shedding SARS-CoV-2 virus would expect to have  a negative (not detected) result in this assay. Performed At: [facility] [address] [place], [place] [id number] Rush Farmer MD VH:[id number]    [place]  Final    Comment: Performed at [facility], [place] [address] [place],  [address]    Procedures and diagnostic studies:  Dg Chest 2 View  Result Date: 12/21/2018 CLINICAL DATA:  Lung biopsy EXAM: CHEST - 2 VIEW COMPARISON:  12/15/2018 FINDINGS: Again noted is a left upper lobe lung mass. There has been interval  development of a left-sided pleural effusion and probable left lower lobe atelectasis. No pneumothorax. No acute osseous abnormality. IMPRESSION: 1. Persistent large left upper lobe lung mass. 2. Interval development of a small left-sided pleural effusion with adjacent atelectasis. Electronically Signed   By: Constance Holster M.D.   On: 12/21/2018 00:41   Dg Chest Port 1 View  Result Date: 12/21/2018 CLINICAL DATA:  Post bronchoscopy. EXAM: PORTABLE CHEST 1 VIEW COMPARISON:  Radiographs 12/21/2018.  CT 11/24/2018. FINDINGS: The heart size and mediastinal contours are stable. The aortic arch remains obscured by the left upper lobe mass and associated volume loss. Patchy left basilar atelectasis and a small left pleural effusion are unchanged. There is no pneumothorax. The right lung is clear. No acute osseous findings. IMPRESSION: No demonstrated complication or significant changes following bronchoscopy. Persistent left upper lobe mass and collapse. Electronically Signed   By: Richardean Sale M.D.   On: 12/21/2018 10:15    Medications:   . albuterol      . feeding supplement  1 Container Oral TID WC  . feeding supplement (ENSURE ENLIVE)  237 mL Oral Daily  . fentaNYL      . levothyroxine  100 mcg Oral QAC breakfast  . multivitamin with minerals  1 tablet Oral Daily  . sodium chloride flush  3 mL Intravenous Q12H   Continuous Infusions: . sodium chloride 100 mL/hr at 12/21/18 0033     LOS: 4 days   Geradine Girt  [facility]   How to contact the [facility] Attending or Consulting provider [place] or covering provider during after hours [place], for this patient?  1. Check the care team in [facility] and look for a) attending/consulting TRH provider listed and b) the [facility] team listed 2. Log into [email or web address] and use [place]'s universal password to access. If you do not have the password, please contact the hospital operator. 3. Locate the [facility] provider you are looking for under [facility] and page to a number that you can be directly reached. 4. If you still have difficulty reaching the provider, please page the [facility] (Director on Call) for the Hospitalists listed on amion for assistance.  12/21/2018, 10:43 AM

## 2018-12-21 NOTE — Anesthesia Procedure Notes (Signed)
Procedure Name: Intubation Date/Time: 12/21/2018 7:44 AM Performed by: Candis Shine, CRNA Pre-anesthesia Checklist: Patient identified, Emergency Drugs available, Suction available and Patient being monitored Patient Re-evaluated:Patient Re-evaluated prior to induction Oxygen Delivery Method: Circle System Utilized Preoxygenation: Pre-oxygenation with 100% oxygen Induction Type: IV induction and Rapid sequence Laryngoscope Size: Mac and 3 Grade View: Grade I Tube type: Oral Tube size: 8.5 mm Number of attempts: 1 Airway Equipment and Method: Stylet Placement Confirmation: ETT inserted through vocal cords under direct vision,  positive ETCO2 and breath sounds checked- equal and bilateral Secured at: 22 cm Tube secured with: Tape Dental Injury: Teeth and Oropharynx as per pre-operative assessment

## 2018-12-21 NOTE — Transfer of Care (Signed)
Immediate Anesthesia Transfer of Care Note  Patient: Yolanda Miller  Procedure(s) Performed: VIDEO BRONCHOSCOPY WITH ENDOBRONCHIAL ULTRASOUND (N/A )  Patient Location: PACU  Anesthesia Type:General  Level of Consciousness: awake, alert  and oriented  Airway & Oxygen Therapy: Patient Spontanous Breathing and Patient connected to face mask oxygen  Post-op Assessment: Report given to RN and Post -op Vital signs reviewed and stable  Post vital signs: Reviewed and stable  Last Vitals:  Vitals Value Taken Time  BP 163/79 12/21/2018  9:31 AM  Temp    Pulse 124 12/21/2018  9:32 AM  Resp 23 12/21/2018  9:32 AM  SpO2 88 % 12/21/2018  9:32 AM  Vitals shown include unvalidated device data.  Last Pain:  Vitals:   12/21/18 0436  TempSrc: Oral  PainSc:       Patients Stated Pain Goal: 0 (48/25/00 3704)  Complications: No apparent anesthesia complications

## 2018-12-21 NOTE — Progress Notes (Signed)
Report given, pt going to OR for Bronchoscopy/Biopsy. Vitals stable. Pre-procedure checklist complete.   Eleanora Neighbor, RN

## 2018-12-21 NOTE — Progress Notes (Addendum)
Re-evaluated patient after 2 doses of albuterol w/o improvement of lung sounds (coarse) or symptoms of dyspnea. No wheezing or stridor.  Repeat x ray is stable from prior.  Given IV lasix x 1 and will monitor.  Also she is very hoarse sounding will try solumedrol x 1 as well.  On 3 L O2 with stats of 98%.  Will continue to monitor closely.  Discussed with PA Harriet Pho from [place].   Eulogio Bear DO  Addendum at 2:15: Improved with IV lasix and IV steroids Will continue to monitor with continuous O2 sats for now-- back on room air per nursing

## 2018-12-21 NOTE — Brief Op Note (Addendum)
12/21/2018  9:36 AM  PATIENT:  Yolanda Miller  64 y.o. female  PRE-OPERATIVE DIAGNOSIS:  LEFT HILAR MASS  POST-OPERATIVE DIAGNOSIS:  Non-small cell carcinoma- stage IV  PROCEDURE:  Procedure(s): VIDEO BRONCHOSCOPY WITH ENDOBRONCHIAL ULTRASOUND (N/A) Brushings, endobronchial biopsies and needle aspiration SURGEON:  Surgeon(s) and Role:    * Melrose Nakayama, MD - Primary  PHYSICIAN ASSISTANT:   ASSISTANTS: none   ANESTHESIA:   general  EBL:  10 mL   BLOOD ADMINISTERED:none  DRAINS: none   LOCAL MEDICATIONS USED:  NONE  SPECIMEN:  Source of Specimen:  Left main stem, left upper lobe, level 4L and 7 nodes  DISPOSITION OF SPECIMEN:  PATHOLOGY  COUNTS:  NO endo  TOURNIQUET:  * No tourniquets in log *  DICTATION: .Other Dictation: Dictation Number -  PLAN OF CARE: Admit to inpatient   PATIENT DISPOSITION:  PACU - hemodynamically stable.   Delay start of Pharmacological VTE agent (>24hrs) due to surgical blood loss or risk of bleeding: resume heparin in 24 hours

## 2018-12-21 NOTE — Interval H&P Note (Signed)
History and Physical Interval Note:  12/21/2018 7:23 AM  Yolanda Miller  has presented today for surgery, with the diagnosis of LEFT HILAR MASS.  The various methods of treatment have been discussed with the patient and family. After consideration of risks, benefits and other options for treatment, the patient has consented to  Procedure(s): VIDEO BRONCHOSCOPY WITH ENDOBRONCHIAL ULTRASOUND (N/A) POSSIBLE MEDIASTINOSCOPY (N/A) as a surgical intervention.  The patient's history has been reviewed, patient examined, no change in status, stable for surgery.  I have reviewed the patient's chart and labs.  Questions were answered to the patient's satisfaction.     Melrose Nakayama

## 2018-12-21 NOTE — Anesthesia Postprocedure Evaluation (Signed)
Anesthesia Post Note  Patient: Yolanda Miller  Procedure(s) Performed: VIDEO BRONCHOSCOPY WITH ENDOBRONCHIAL ULTRASOUND (N/A )     Patient location during evaluation: PACU Anesthesia Type: General Level of consciousness: awake and alert, patient cooperative and oriented Pain management: pain level controlled Vital Signs Assessment: post-procedure vital signs reviewed and stable Respiratory status: spontaneous breathing, nonlabored ventilation, respiratory function stable and patient connected to nasal cannula oxygen Cardiovascular status: blood pressure returned to baseline and stable Postop Assessment: no apparent nausea or vomiting Anesthetic complications: no    Last Vitals:  Vitals:   12/21/18 1029 12/21/18 1129  BP: (!) 147/71   Pulse: 79 98  Resp: 20 16  Temp:    SpO2: 96% 95%    Last Pain:  Vitals:   12/21/18 1253  TempSrc:   PainSc: 7                  Kerigan Narvaez,E. Jullia Mulligan

## 2018-12-21 NOTE — Progress Notes (Signed)
ANTICOAGULATION CONSULT NOTE - Follow-Up Consult  Pharmacy Consult for Heparin Indication: Renal infarct/thrombus  Patient Measurements: Height: 5\' 4"  (162.6 cm) Weight: 139 lb 12.4 oz (63.4 kg) IBW/kg (Calculated) : 54.7 Heparin Dosing Weight: 61.2 kg  Vital Signs: Temp: 97.9 F (36.6 C) (05/26 0930) Temp Source: Oral (05/26 0436) BP: 147/71 (05/26 1029) Pulse Rate: 98 (05/26 1129)  Labs: Recent Labs    12/18/18 2358 12/19/18 0757 12/19/18 1414 12/20/18 0424 12/20/18 0909 12/20/18 1922 12/21/18 0336  HGB 10.4*  --   --  9.7*  --   --  9.0*  HCT 31.9*  --   --  29.9*  --   --  27.9*  PLT 215  --   --  218  --   --  229  HEPARINUNFRC 0.25* 0.32 0.31  --  0.21* 0.49  --   CREATININE  --  1.15*  --  1.06*  --   --  0.97    Estimated Creatinine Clearance: 52.6 mL/min (by C-G formula based on SCr of 0.97 mg/dL).   Assessment: Yolanda Miller presenting with abdominal/back pain with N/V found to have occlusive thrombus in left renal artery and pharmacy consulted to start heparin.  Heparin infusion was stopped for bronch this AM. Per CT surgeon's note, resume heparin infusion 24 hrs after procedure. Hgb 10.4>9, pltc wnl.   Goal of Therapy:   Heparin level 0.3-0.7 units/ml Monitor platelets by anticoagulation protocol: Yes   Plan:  -Restart heparin infusion at 1650 units/hr at 09:30 AM on 12/22/18 (24 hrs after bronch) - Check heparin level 6 hrs after re-starting the infusion - Daily heparin level and CBC  Gillermina Hu, PharmD, BCPS, [facility] Clinical Pharmacist 12/21/2018 1:56 PM

## 2018-12-22 ENCOUNTER — Encounter (HOSPITAL_COMMUNITY): Payer: Self-pay | Admitting: Thoracic Surgery (Cardiothoracic Vascular Surgery)

## 2018-12-22 LAB — GLUCOSE, CAPILLARY: Glucose-Capillary: 120 mg/dL — ABNORMAL HIGH (ref 70–99)

## 2018-12-22 LAB — BASIC METABOLIC PANEL
Anion gap: 12 (ref 5–15)
BUN: 8 mg/dL (ref 8–23)
CO2: 25 mmol/L (ref 22–32)
Calcium: 8.1 mg/dL — ABNORMAL LOW (ref 8.9–10.3)
Chloride: 106 mmol/L (ref 98–111)
Creatinine, Ser: 1.16 mg/dL — ABNORMAL HIGH (ref 0.44–1.00)
GFR calc Af Amer: 59 mL/min — ABNORMAL LOW (ref >60)
GFR calc non Af Amer: 51 mL/min — ABNORMAL LOW (ref >60)
Glucose, Bld: 131 mg/dL — ABNORMAL HIGH (ref 70–99)
Potassium: 3.4 mmol/L — ABNORMAL LOW (ref 3.5–5.1)
Sodium: 143 mmol/L (ref 135–145)

## 2018-12-22 LAB — CBC
HCT: 27.5 % — ABNORMAL LOW (ref 36.0–46.0)
Hemoglobin: 8.9 g/dL — ABNORMAL LOW (ref 12.0–15.0)
MCH: 28.8 pg (ref 26.0–34.0)
MCHC: 32.4 g/dL (ref 30.0–36.0)
MCV: 89 fL (ref 80.0–100.0)
Platelets: 273 10*3/uL (ref 150–400)
RBC: 3.09 MIL/uL — ABNORMAL LOW (ref 3.87–5.11)
RDW: 12.7 % (ref 11.5–15.5)
WBC: 16.4 10*3/uL — ABNORMAL HIGH (ref 4.0–10.5)
nRBC: 0 % (ref 0.0–0.2)

## 2018-12-22 LAB — HEPARIN LEVEL (UNFRACTIONATED): Heparin Unfractionated: 0.17 IU/mL — ABNORMAL LOW (ref 0.30–0.70)

## 2018-12-22 MED ORDER — HEPARIN (PORCINE) 25000 UT/250ML-% IV SOLN
1850.0000 [IU]/h | INTRAVENOUS | Status: DC
  Administered 2018-12-22: 1650 [IU]/h via INTRAVENOUS
  Administered 2018-12-23: 1850 [IU]/h via INTRAVENOUS
  Filled 2018-12-22 (×2): qty 250

## 2018-12-22 MED ORDER — POTASSIUM CHLORIDE CRYS ER 20 MEQ PO TBCR
40.0000 meq | EXTENDED_RELEASE_TABLET | Freq: Once | ORAL | Status: AC
  Administered 2018-12-22: 40 meq via ORAL
  Filled 2018-12-22: qty 2

## 2018-12-22 MED ORDER — ALPRAZOLAM 0.5 MG PO TABS
0.5000 mg | ORAL_TABLET | Freq: Three times a day (TID) | ORAL | Status: DC | PRN
  Administered 2018-12-22: 0.5 mg via ORAL
  Filled 2018-12-22: qty 1

## 2018-12-22 NOTE — Progress Notes (Signed)
Nutrition Follow-up  DOCUMENTATION CODES:   Not applicable  INTERVENTION:   -Continue Ensure Enlive po daily, each supplement provides 350 kcal and 20 grams of protein -Continue Boost Breeze po TID, each supplement provides 250 kcal and 9 grams of protein -Continue MVI with minerals daily  NUTRITION DIAGNOSIS:   Increased nutrient needs related to cancer and cancer related treatments as evidenced by estimated needs.  Ongoing  GOAL:   Patient will meet greater than or equal to 90% of their needs  Progressing   MONITOR:   PO intake, Supplement acceptance, Labs, Weight trends, Skin, I & O's  REASON FOR ASSESSMENT:   Malnutrition Screening Tool    ASSESSMENT:   Yolanda Miller is a 63 y.o. female with medical history significant for hypothyroidism and metastatic cancer currently being worked up, and now presenting to the emergency department for evaluation of severe left flank and lower quadrant abdomen pain.  She reports developing severe pain in the left flank and lower abdomen this morning and has had some nausea with a couple episodes of nonbloody vomiting associated with this.  She was initially attributing this to constipation as she had not moved her bowels in a couple days, but the pain was becoming more severe and so she came in for evaluation.  She denies any chest pain or palpitations, denies any pain, swelling, or discoloration of the lower extremities, and denies any recent fevers or chills.  She has a mild nonproductive cough that has not changed much in recent weeks.  Patient has been following with oncology for a lung mass concerning for small cell lung cancer and with metastatic right iliac lesion and peritoneal nodule, was scheduled with CT surgery for bronchoscopy and biopsy on 12/17/2018.   5/26- bronchoscopy concerning for non small cell carcinoma stg IV  Plan for pt to start radiation upon discharge.   RD working remotely.  Spoke with pt via phone. States  her appetite remains poor and she just doesn't feel like eating. Denies any nausea/vomiting. Consumed bites of cereal for breakfast this am. Meal completions charted as 0-50% for pt's last five meals. Pt reports she is compliant with Boost but has a hard time finishing an entire Ensure due to thickness/taste. Discussed the importance of protein intake for preservation of lean body mass. Pt would like to keep both supplements ordered.   Weight noted to increase from 61.1 kg on 5/22 to 63 kg today.   I/O: +2,339 ml since admit UOP: 900 ml x 24 hrs   Medications: MVI with minerals  Labs: K 3.4 (L)   Diet Order:   Diet Order            Diet regular Room service appropriate? Yes; Fluid consistency: Thin  Diet effective now              EDUCATION NEEDS:   Education needs have been addressed  Skin:  Skin Assessment: Reviewed RN Assessment  Last BM:  12/15/18  Height:   Ht Readings from Last 1 Encounters:  12/16/18 5\' 4"  (1.626 m)    Weight:   Wt Readings from Last 1 Encounters:  12/22/18 63 kg    Ideal Body Weight:  54.5 kg  BMI:  Body mass index is 23.84 kg/m.  Estimated Nutritional Needs:   Kcal:  1700-1900  Protein:  90-105 grams  Fluid:  > 1.7 L  Mariana Single RD, LDN Clinical Nutrition Pager # [phone]

## 2018-12-22 NOTE — Progress Notes (Signed)
PROGRESS NOTE    Yolanda Miller  VOH:[id number] DOB: Feb 15, 1956 DOA: 12/16/2018 PCP: [facility]   Brief Narrative:  63 year old with past medical history of hypothyroidism and metastatic unknown came to the hospital with complaints of left-sided flank pain.  CT showed left-sided renal infarct with concerns of right-sided abdominal infarction 2.  Started on heparin drip.  Bronchoscopy performed on 5/26 by CT surgery.  Results are concerning for malignant cells, type of malignancy pending.   Assessment & Plan:   Principal Problem:   Renal infarction [facility]) Active Problems:   Hypothyroidism   Mass of upper lobe of left lung   Renal arterial thrombosis (HCC)   Cancer-related pain   Mild renal insufficiency   Renal infarct (HCC)   Non-small cell carcinoma of lung (HCC)  Bilateral renal infarction, left-sided renal thrombosis, acute - It has been 24 hours since bronchoscopy/biopsy, will resume heparin drip with eventual transition to Eliquis. -Hypercoagulable state secondary to malignancy  Stage IV metastatic cancer, unknown -Likely non-small cell lung cancer.  Status post bronchoscopy with biopsy on 5/26.  Biopsy results concerning for malignancy/positive malignant cells.  Patient also has metastatic bone disease. -Very high risk for fracture, advised to use crutches.  Acute kidney injury resolved with IV fluids  Anemia of chronic disease -No evidence of bleeding.  Hypothyroidism -Continue Synthroid  Tobacco use -Counseled to quit using tobacco.  DVT prophylaxis: Heparin drip Code Status: Full code Family Communication: To be determined Disposition Plan: IV heparin started, will transition to Eliquis over next 24 hours.  Need to monitor for any further signs of bleeding in the meantime requires pain control  Consultants:   CT surgery  Procedures:   Bronchoscopy 5/26  Antimicrobials:   None   Subjective: Feels like she still having  left-sided abdominal pain otherwise no other complaints.  Review of Systems Otherwise negative except as per HPI, including: General: Denies fever, chills, night sweats or unintended weight loss. Resp: Denies cough, wheezing, shortness of breath. Cardiac: Denies chest pain, palpitations, orthopnea, paroxysmal nocturnal dyspnea. GI: Denies abdominal pain, nausea, vomiting, diarrhea or constipation GU: Denies dysuria, frequency, hesitancy or incontinence MS: Denies muscle aches, joint pain or swelling Neuro: Denies headache, neurologic deficits (focal weakness, numbness, tingling), abnormal gait Psych: Denies anxiety, depression, SI/HI/AVH Skin: Denies new rashes or lesions ID: Denies sick contacts, exotic exposures, travel  Objective: Vitals:   12/22/18 0300 12/22/18 0448 12/22/18 0849 12/22/18 1006  BP:  135/74  117/64  Pulse:  73  98  Resp:  (!) 24  18  Temp:  98.3 F (36.8 C)  99 F (37.2 C)  TempSrc:  Oral  Oral  SpO2:  96% 95% 97%  Weight: 63 kg     Height:        Intake/Output Summary (Last 24 hours) at 12/22/2018 1241 Last data filed at 12/22/2018 0900 Gross per 24 hour  Intake 880 ml  Output 900 ml  Net -20 ml   Filed Weights   12/19/18 2123 12/20/18 2114 12/22/18 0300  Weight: 62.8 kg 63.4 kg 63 kg    Examination:  General exam: Appears calm and comfortable  Respiratory system: Clear to auscultation. Respiratory effort normal. Cardiovascular system: S1 & S2 heard, RRR. No JVD, murmurs, rubs, gallops or clicks. No pedal edema. Gastrointestinal system: Abdomen is nondistended, soft and nontender. No organomegaly or masses felt. Normal bowel sounds heard. Central nervous system: Alert and oriented. No focal neurological deficits. Extremities: Symmetric 5 x 5 power. Skin:  No rashes, lesions or ulcers Psychiatry: Judgement and insight appear normal. Mood & affect appropriate.     Data Reviewed:   CBC: Recent Labs  Lab 12/18/18 0449 12/18/18 2358 12/20/18  0424 12/21/18 0336 12/22/18 0421  WBC 18.7* 14.7* 10.0 9.5 16.4*  HGB 12.0 10.4* 9.7* 9.0* 8.9*  HCT 37.1 31.9* 29.9* 27.9* 27.5*  MCV 89.2 88.9 89.8 89.7 89.0  PLT 241 215 218 229 992   Basic Metabolic Panel: Recent Labs  Lab 12/18/18 0449 12/19/18 0757 12/20/18 0424 12/21/18 0336 12/22/18 0421  NA 135 139 140 140 143  K 4.6 4.1 3.9 3.6 3.4*  CL 98 102 107 111 106  CO2 23 25 24 22 25   GLUCOSE 134* 146* 116* 102* 131*  BUN 11 9 7* 5* 8  CREATININE 1.41* 1.15* 1.06* 0.97 1.16*  CALCIUM 9.0 8.6* 8.0* 8.0* 8.1*   GFR: Estimated Creatinine Clearance: 44 mL/min (A) (by C-G formula based on SCr of 1.16 mg/dL (H)). Liver Function Tests: Recent Labs  Lab 12/16/18 1935 12/21/18 0336  AST 27 14*  ALT 21 37  ALKPHOS 76 78  BILITOT 0.6 0.4  PROT 7.5 5.5*  ALBUMIN 4.1 2.4*   Recent Labs  Lab 12/16/18 1935  LIPASE 21   No results for input(s): AMMONIA in the last 168 hours. Coagulation Profile: No results for input(s): INR, PROTIME in the last 168 hours. Cardiac Enzymes: No results for input(s): CKTOTAL, CKMB, CKMBINDEX, TROPONINI in the last 168 hours. BNP (last 3 results) No results for input(s): PROBNP in the last 8760 hours. HbA1C: No results for input(s): HGBA1C in the last 72 hours. CBG: Recent Labs  Lab 12/17/18 0744 12/18/18 0650 12/19/18 0720 12/22/18 0729  GLUCAP 107* 118* 130* 120*   Lipid Profile: No results for input(s): CHOL, HDL, LDLCALC, TRIG, CHOLHDL, LDLDIRECT in the last 72 hours. Thyroid Function Tests: No results for input(s): TSH, T4TOTAL, FREET4, T3FREE, THYROIDAB in the last 72 hours. Anemia Panel: No results for input(s): VITAMINB12, FOLATE, FERRITIN, TIBC, IRON, RETICCTPCT in the last 72 hours. Sepsis Labs: No results for input(s): PROCALCITON, LATICACIDVEN in the last 168 hours.  Recent Results (from the past 240 hour(s))  Surgical pcr screen     Status: None   Collection Time: 12/15/18  9:42 AM  Result Value Ref Range Status    MRSA, PCR NEGATIVE NEGATIVE Final   Staphylococcus aureus NEGATIVE NEGATIVE Final    Comment: (NOTE) The Xpert SA Assay (FDA approved for NASAL specimens in patients 109 years of age and older), is one component of a comprehensive surveillance program. It is not intended to diagnose infection nor to guide or monitor treatment. Performed at [facility], [place] [address], [place],  [address]   Novel Coronavirus, NAA (hospital order; send-out to ref lab)     Status: None   Collection Time: 12/15/18 10:13 AM  Result Value Ref Range Status   SARS-CoV-2, NAA NOT DETECTED NOT DETECTED Final    Comment: (NOTE) Testing was performed using the cobas(R) SARS-CoV-2 test. This test was developed and its performance characteristics determined by [employer]. This test has not been FDA cleared or approved. This test has been authorized by FDA under an Emergency Use Authorization (EUA). This test is only authorized for the duration of time the declaration that circumstances exist justifying the authorization of the emergency use of in vitro diagnostic tests for detection of SARS-CoV-2 virus and/or diagnosis of COVID-19 infection under section 564(b)(1) of the Act, 21 U.S.C. 419QQI-2(L)(7), unless  the authorization is terminated or revoked sooner. When diagnostic testing is negative, the possibility of a false negative result should be considered in the context of a patient's recent exposures and the presence of clinical signs and symptoms consistent with COVID-19. An individual without symptoms of COVID-19 and who is not shedding SARS-CoV-2 virus would expect to have  a negative (not detected) result in this assay. Performed At: [facility] [address] [place], [place] [id number] Rush Farmer MD KW:[id number]    [place]  Final    Comment: Performed at [facility], [place] [address] [place], [place] [address]          Radiology Studies: Dg Chest 2 View  Result Date: 12/21/2018 CLINICAL DATA:  Lung biopsy EXAM: CHEST - 2 VIEW COMPARISON:  12/15/2018 FINDINGS: Again noted is a left upper lobe lung mass. There has been interval development of a left-sided pleural effusion and probable left lower lobe atelectasis. No pneumothorax. No acute osseous abnormality. IMPRESSION: 1. Persistent large left upper lobe lung mass. 2. Interval development of a small left-sided pleural effusion with adjacent atelectasis. Electronically Signed   By: Constance Holster M.D.   On: 12/21/2018 00:41   Dg Chest Port 1 View  Result Date: 12/21/2018 CLINICAL DATA:  Dyspnea EXAM: PORTABLE CHEST 1 VIEW COMPARISON:  Earlier today at 10 a.m. FINDINGS: Stable left upper lobe mass. There is persistent volume loss in the left lung. No pneumothorax. Right lung is clear. Normal heart size. IMPRESSION: Stable left upper lobe mass.  No pneumothorax. Electronically Signed   By: Marybelle Killings M.D.   On: 12/21/2018 13:05   Dg Chest Port 1 View  Result Date: 12/21/2018 CLINICAL DATA:  Post bronchoscopy. EXAM: PORTABLE CHEST 1 VIEW COMPARISON:  Radiographs 12/21/2018.  CT 11/24/2018. FINDINGS: The heart size and mediastinal contours are stable. The aortic arch remains obscured by the left upper lobe mass and associated volume loss. Patchy left basilar atelectasis and a small left pleural effusion are unchanged. There is no pneumothorax. The right lung is clear. No acute osseous findings. IMPRESSION: No demonstrated complication or significant changes following bronchoscopy. Persistent left upper lobe mass and collapse. Electronically Signed   By: Richardean Sale M.D.   On: 12/21/2018 10:15        Scheduled Meds: . feeding supplement  1 Container Oral TID WC  . feeding supplement (ENSURE ENLIVE)  237 mL Oral Daily  . ipratropium-albuterol  3 mL Nebulization Q6H  . levothyroxine  100 mcg Oral QAC breakfast  . multivitamin with minerals  1 tablet  Oral Daily  . sodium chloride flush  3 mL Intravenous Q12H   Continuous Infusions: . heparin 1,650 Units/hr (12/22/18 0939)     LOS: 5 days   Time spent= 40 mins     Arsenio Loader, MD [facility]  If 7PM-7AM, please contact night-coverage [email or web address] 12/22/2018, 12:41 PM

## 2018-12-22 NOTE — Progress Notes (Signed)
ANTICOAGULATION CONSULT NOTE - Follow-Up Consult  Pharmacy Consult for Heparin Indication: Renal infarct/thrombus  Patient Measurements: Height: 5\' 4"  (162.6 cm) Weight: 138 lb 14.2 oz (63 kg) IBW/kg (Calculated) : 54.7 Heparin Dosing Weight: 61.2 kg  Vital Signs: Temp: 98.4 F (36.9 C) (05/27 1615) Temp Source: Oral (05/27 1615) BP: 130/71 (05/27 1615) Pulse Rate: 87 (05/27 1615)  Labs: Recent Labs    12/20/18 0424 12/20/18 0909 12/20/18 1922 12/21/18 0336 12/22/18 0421 12/22/18 1625  HGB 9.7*  --   --  9.0* 8.9*  --   HCT 29.9*  --   --  27.9* 27.5*  --   PLT 218  --   --  229 273  --   HEPARINUNFRC  --  0.21* 0.49  --   --  0.17*  CREATININE 1.06*  --   --  0.97 1.16*  --     Estimated Creatinine Clearance: 44 mL/min (A) (by C-G formula based on SCr of 1.16 mg/dL (H)).   Assessment: 86 YOF presenting with abdominal/back pain with N/V found to have occlusive thrombus in left renal artery and pharmacy consulted to start heparin.  Heparin previously therapeutic on 1650 units/hr, then held 5/26 for bronch and restarted this morning. Initial heparin level on restart is subtherapeutic at 0.17, no issues with infusion per nursing.   Goal of Therapy:   Heparin level 0.3-0.7 units/ml Monitor platelets by anticoagulation protocol: Yes   Plan:  -Increase heparin to 1850 units/h -Recheck heparin level in [place], PharmD, BCPS Clinical Pharmacist [phone] Please check AMION for all [place] numbers 12/22/2018

## 2018-12-23 LAB — CBC
HCT: 29.6 % — ABNORMAL LOW (ref 36.0–46.0)
Hemoglobin: 9.7 g/dL — ABNORMAL LOW (ref 12.0–15.0)
MCH: 29.2 pg (ref 26.0–34.0)
MCHC: 32.8 g/dL (ref 30.0–36.0)
MCV: 89.2 fL (ref 80.0–100.0)
Platelets: 341 10*3/uL (ref 150–400)
RBC: 3.32 MIL/uL — ABNORMAL LOW (ref 3.87–5.11)
RDW: 13 % (ref 11.5–15.5)
WBC: 17.3 10*3/uL — ABNORMAL HIGH (ref 4.0–10.5)
nRBC: 0 % (ref 0.0–0.2)

## 2018-12-23 LAB — BASIC METABOLIC PANEL
Anion gap: 12 (ref 5–15)
BUN: 12 mg/dL (ref 8–23)
CO2: 26 mmol/L (ref 22–32)
Calcium: 7.9 mg/dL — ABNORMAL LOW (ref 8.9–10.3)
Chloride: 104 mmol/L (ref 98–111)
Creatinine, Ser: 0.99 mg/dL (ref 0.44–1.00)
GFR calc Af Amer: >60 mL/min (ref >60)
GFR calc non Af Amer: >60 mL/min (ref >60)
Glucose, Bld: 104 mg/dL — ABNORMAL HIGH (ref 70–99)
Potassium: 3.2 mmol/L — ABNORMAL LOW (ref 3.5–5.1)
Sodium: 142 mmol/L (ref 135–145)

## 2018-12-23 LAB — HEPARIN LEVEL (UNFRACTIONATED)
Heparin Unfractionated: 0.58 IU/mL (ref 0.30–0.70)
Heparin Unfractionated: 0.41 IU/mL (ref 0.30–0.70)

## 2018-12-23 LAB — GLUCOSE, CAPILLARY: Glucose-Capillary: 118 mg/dL — ABNORMAL HIGH (ref 70–99)

## 2018-12-23 LAB — MAGNESIUM: Magnesium: 2 mg/dL (ref 1.7–2.4)

## 2018-12-23 MED ORDER — APIXABAN 5 MG PO TABS
10.0000 mg | ORAL_TABLET | Freq: Two times a day (BID) | ORAL | Status: DC
  Administered 2018-12-23 – 2018-12-24 (×3): 10 mg via ORAL
  Filled 2018-12-23 (×3): qty 2

## 2018-12-23 MED ORDER — APIXABAN 5 MG PO TABS: 5.0000 mg | ORAL_TABLET | Freq: Two times a day (BID) | ORAL | Status: DC

## 2018-12-23 MED ORDER — SODIUM CHLORIDE 0.9 % IV SOLN
INTRAVENOUS | Status: AC
  Administered 2018-12-23 – 2018-12-24 (×2): via INTRAVENOUS

## 2018-12-23 MED ORDER — POTASSIUM CHLORIDE CRYS ER 20 MEQ PO TBCR
40.0000 meq | EXTENDED_RELEASE_TABLET | Freq: Once | ORAL | Status: AC
  Administered 2018-12-23: 40 meq via ORAL
  Filled 2018-12-23: qty 2

## 2018-12-23 NOTE — Discharge Instructions (Signed)
Information on my medicine - ELIQUIS (apixaban)  This medication education was reviewed with me or my healthcare representative as part of my discharge preparation.  T  Why was Eliquis prescribed for you? Eliquis was prescribed to treat blood clots that may have been found in the veins of your legs (deep vein thrombosis) or in your lungs (pulmonary embolism) and to reduce the risk of them occurring again.  What do You need to know about Eliquis ? The starting dose is 10 mg (two 5 mg tablets) taken TWICE daily for the FIRST SEVEN (7) DAYS, then on 12/30/18  the dose is reduced to ONE 5 mg tablet taken TWICE daily.  Eliquis may be taken with or without food.   Try to take the dose about the same time in the morning and in the evening. If you have difficulty swallowing the tablet whole please discuss with your pharmacist how to take the medication safely.  Take Eliquis exactly as prescribed and DO NOT stop taking Eliquis without talking to the doctor who prescribed the medication.  Stopping may increase your risk of developing a new blood clot.  Refill your prescription before you run out.  After discharge, you should have regular check-up appointments with your healthcare provider that is prescribing your Eliquis.    What do you do if you miss a dose? If a dose of ELIQUIS is not taken at the scheduled time, take it as soon as possible on the same day and twice-daily administration should be resumed. The dose should not be doubled to make up for a missed dose.  Important Safety Information A possible side effect of Eliquis is bleeding. You should call your healthcare provider right away if you experience any of the following: ? Bleeding from an injury or your nose that does not stop. ? Unusual colored urine (red or dark brown) or unusual colored stools (red or black). ? Unusual bruising for unknown reasons. ? A serious fall or if you hit your head (even if there is no bleeding).  Some  medicines may interact with Eliquis and might increase your risk of bleeding or clotting while on Eliquis. To help avoid this, consult your healthcare provider or pharmacist prior to using any new prescription or non-prescription medications, including herbals, vitamins, non-steroidal anti-inflammatory drugs (NSAIDs) and supplements.  This website has more information on Eliquis (apixaban): http://www.eliquis.com/eliquis/home

## 2018-12-23 NOTE — Progress Notes (Addendum)
ANTICOAGULATION CONSULT NOTE - [place] for Heparin (see addendum for Elquis dosing) Indication: Renal infarct/thrombus  Patient Measurements: Height: 5\' 4"  (162.6 cm) Weight: 137 lb 9.6 oz (62.4 kg) IBW/kg (Calculated) : 54.7 Heparin Dosing Weight: 61.2 kg  Vital Signs: Temp: 99.1 F (37.3 C) (05/28 0850) Temp Source: Oral (05/28 0850) BP: 148/79 (05/28 0850) Pulse Rate: 91 (05/28 0850)  Labs: Recent Labs    12/21/18 0336 12/22/18 0421 12/22/18 1625 12/23/18 0032 12/23/18 0443  HGB 9.0* 8.9*  --  9.7*  --   HCT 27.9* 27.5*  --  29.6*  --   PLT 229 273  --  341  --   HEPARINUNFRC  --   --  0.17* 0.58 0.41  CREATININE 0.97 1.16*  --  0.99  --     Estimated Creatinine Clearance: 51.5 mL/min (by C-G formula based on SCr of 0.99 mg/dL).   Assessment: Yolanda Miller presenting with abdominal/back pain with N/V found to have occlusive thrombus in left renal artery and pharmacy consulted to start heparin, initially on 12/16/18. Heparin was stopped for bronch on 5/26, then restarted heparin 24 hrs after procedure (09:00 AM 12/22/18).   Heparin level this am 0.41 units/ml, remains therapeutic on heparin drip 1850 units/hr. No bleeding noted.  Goal of Therapy:   Heparin level 0.3-0.7 units/ml Monitor platelets by anticoagulation protocol: Yes   Plan:  -Continue heparin at 1850 units/h -Daily heparin level and CBC  Thanks for allowing pharmacy to be a part of this patient's care.  Nicole Cella, RPh Clinical Pharmacist [phone] Pager: [phone] Please check AMION for all [place] phone numbers After 10:00 PM, call [place] [phone] 12/23/2018 11:05 AM   ADDENDUM:  Pharmacy Consult for Heparin  > transition to Eliquis for renal thrombus in setting of metastatic cancer.  SCr = 0.99  Plan: Dc heparin drip then give po Eliquis 10 mg po bid (starting after heparin discontinued) x 7 days then on 12/30/18 reduce dose to 5 mg bid. Monitor for signs and  symptoms of bleeding.  Nicole Cella, RPh Clinical Pharmacist [phone] Pager: [phone] Please check AMION for all [place] phone numbers After 10:00 PM, call [place] [phone] 12/23/2018 1:08 PM

## 2018-12-23 NOTE — Progress Notes (Signed)
ANTICOAGULATION CONSULT NOTE - Follow-Up Consult  Pharmacy Consult for Heparin Indication: Renal infarct/thrombus  Patient Measurements: Height: 5\' 4"  (162.6 cm) Weight: 138 lb 14.2 oz (63 kg) IBW/kg (Calculated) : 54.7 Heparin Dosing Weight: 61.2 kg  Vital Signs: Temp: 98.5 F (36.9 C) (05/27 2022) Temp Source: Oral (05/27 2022) BP: 139/74 (05/27 2022) Pulse Rate: 89 (05/27 2022)  Labs: Recent Labs    12/20/18 0424  12/20/18 1922 12/21/18 0336 12/22/18 0421 12/22/18 1625 12/23/18 0032  HGB 9.7*  --   --  9.0* 8.9*  --  9.7*  HCT 29.9*  --   --  27.9* 27.5*  --  29.6*  PLT 218  --   --  229 273  --  341  HEPARINUNFRC  --    < > 0.49  --   --  0.17* 0.58  CREATININE 1.06*  --   --  0.97 1.16*  --   --    < > = values in this interval not displayed.    Estimated Creatinine Clearance: 44 mL/min (A) (by C-G formula based on SCr of 1.16 mg/dL (H)).   Assessment: 4 YOF presenting with abdominal/back pain with N/V found to have occlusive thrombus in left renal artery and pharmacy consulted to start heparin.  Heparin level this am 0.58 units/ml   Goal of Therapy:   Heparin level 0.3-0.7 units/ml Monitor platelets by anticoagulation protocol: Yes   Plan:  -Continue heparin at 1850 units/h -Daily heparin level and CBC  Thanks for allowing pharmacy to be a part of this patient's care.  Excell Seltzer, PharmD Clinical Pharmacist

## 2018-12-23 NOTE — Progress Notes (Signed)
PROGRESS NOTE    Yolanda Miller  WUJ:[id number] DOB: [birth year] DOA: 12/16/2018 PCP: [facility]   Brief Narrative:  63 year old with past medical history of hypothyroidism and metastatic unknown came to the hospital with complaints of left-sided flank pain.  CT showed left-sided renal infarct with concerns of right-sided abdominal infarction 2.  Started on heparin drip.  Bronchoscopy performed on 5/26 by CT surgery.  Results are concerning for malignant cells, type of malignancy pending.   Assessment & Plan:   Principal Problem:   Renal infarction [facility]) Active Problems:   Hypothyroidism   Mass of upper lobe of left lung   Renal arterial thrombosis (HCC)   Cancer-related pain   Mild renal insufficiency   Renal infarct (HCC)   Non-small cell carcinoma of lung (HCC)  Bilateral renal infarction, left-sided renal thrombosis, acute Left sided abd pain, secondary to infarction.  - It has been 24 hours since bronchoscopy/biopsy, Did well on  Heparin drip overnight. Will transition her to Eliquis. Will give IVF today due to poor po intake from abd pain and concerns of dehydration.  -Hypercoagulable state secondary to malignancy  Stage IV metastatic cancer, unknown -Likely non-small cell lung cancer.  Status post bronchoscopy with biopsy on 5/26.  Biopsy results concerning for malignancy/positive malignant cells.  Patient also has metastatic bone disease. -Very high risk for fracture, advised to use crutches.  Leukocytosis- not sure of the exact etiology, no obvious evidence of infection. Monitor off Abx. If spikes a fever, will start Abx.   Acute kidney injury resolved with IV fluids  Anemia of chronic disease -No evidence of bleeding.  Hypothyroidism -Continue Synthroid  Tobacco use -Counseled to quit using tobacco.  DVT prophylaxis: Heparin drip Code Status: Full code Family Communication:  Disposition Plan: Maintain hospital stay, Patient Is  still feeling quite weak overall with has abdominal Pain. Due to pain her PO intake is inconsistent.   Consultants:   CT surgery  Procedures:   Bronchoscopy 5/26  Antimicrobials:   None   Subjective: Still having quite a bit of left sided abd pain. Feels weak overall due to poor po intake.   Review of Systems Otherwise negative except as per HPI, including: General = no fevers, chills, dizziness, malaise, fatigue HEENT/EYES = negative for pain, redness, loss of vision, double vision, blurred vision, loss of hearing, sore throat, hoarseness, dysphagia Cardiovascular= negative for chest pain, palpitation, murmurs, lower extremity swelling Respiratory/lungs= negative for shortness of breath, cough, hemoptysis, wheezing, mucus production Gastrointestinal= negative for nausea, vomiting, melena, hematemesis Genitourinary= negative for Dysuria, Hematuria, Change in Urinary Frequency MSK = Negative for arthralgia, myalgias, Back Pain, Joint swelling  Neurology= Negative for headache, seizures, numbness, tingling  Psychiatry= Negative for anxiety, depression, suicidal and homocidal ideation Allergy/Immunology= Medication/Food allergy as listed  Skin= Negative for Rash, lesions, ulcers, itching  Objective: Vitals:   12/23/18 0341 12/23/18 0418 12/23/18 0811 12/23/18 0850  BP:  134/76  (!) 148/79  Pulse:  93  91  Resp:  18  18  Temp:  98.3 F (36.8 C)  99.1 F (37.3 C)  TempSrc:  Oral  Oral  SpO2: 95% 93% 94% 95%  Weight:  62.4 kg    Height:        Intake/Output Summary (Last 24 hours) at 12/23/2018 1130 Last data filed at 12/23/2018 1000 Gross per 24 hour  Intake 995.38 ml  Output 1500 ml  Net -504.62 ml   Filed Weights   12/20/18 2114 12/22/18 0300 12/23/18  0418  Weight: 63.4 kg 63 kg 62.4 kg    Examination:  Constitutional: NAD, calm, comfortable Eyes: PERRL, lids and conjunctivae normal ENMT: Mucous membranes are DRY. Posterior pharynx clear of any exudate or  lesions.Normal dentition.  Neck: normal, supple, no masses, no thyromegaly Respiratory: clear to auscultation bilaterally, no wheezing, no crackles. Normal respiratory effort. No accessory muscle use.  Cardiovascular: Regular rate and rhythm, no murmurs / rubs / gallops. No extremity edema. 2+ pedal pulses. No carotid bruits.  Abdomen: no tenderness, no masses palpated. No hepatosplenomegaly. Bowel sounds positive.  Musculoskeletal: no clubbing / cyanosis. No joint deformity upper and lower extremities. Good ROM, no contractures. Normal muscle tone.  Skin: no rashes, lesions, ulcers. No induration Neurologic: CN 2-12 grossly intact. Sensation intact, DTR normal. Strength 5/5 in all 4.  Psychiatric: Normal judgment and insight. Alert and oriented x 3. Normal mood.   Data Reviewed:   CBC: Recent Labs  Lab 12/18/18 2358 12/20/18 0424 12/21/18 0336 12/22/18 0421 12/23/18 0032  WBC 14.7* 10.0 9.5 16.4* 17.3*  HGB 10.4* 9.7* 9.0* 8.9* 9.7*  HCT 31.9* 29.9* 27.9* 27.5* 29.6*  MCV 88.9 89.8 89.7 89.0 89.2  PLT 215 218 229 273 440   Basic Metabolic Panel: Recent Labs  Lab 12/19/18 0757 12/20/18 0424 12/21/18 0336 12/22/18 0421 12/23/18 0032  NA 139 140 140 143 142  K 4.1 3.9 3.6 3.4* 3.2*  CL 102 107 111 106 104  CO2 25 24 22 25 26   GLUCOSE 146* 116* 102* 131* 104*  BUN 9 7* 5* 8 12  CREATININE 1.15* 1.06* 0.97 1.16* 0.99  CALCIUM 8.6* 8.0* 8.0* 8.1* 7.9*  MG  --   --   --   --  2.0   GFR: Estimated Creatinine Clearance: 51.5 mL/min (by C-G formula based on SCr of 0.99 mg/dL). Liver Function Tests: Recent Labs  Lab 12/16/18 1935 12/21/18 0336  AST 27 14*  ALT 21 37  ALKPHOS 76 78  BILITOT 0.6 0.4  PROT 7.5 5.5*  ALBUMIN 4.1 2.4*   Recent Labs  Lab 12/16/18 1935  LIPASE 21   No results for input(s): AMMONIA in the last 168 hours. Coagulation Profile: No results for input(s): INR, PROTIME in the last 168 hours. Cardiac Enzymes: No results for input(s): CKTOTAL,  CKMB, CKMBINDEX, TROPONINI in the last 168 hours. BNP (last 3 results) No results for input(s): PROBNP in the last 8760 hours. HbA1C: No results for input(s): HGBA1C in the last 72 hours. CBG: Recent Labs  Lab 12/17/18 0744 12/18/18 0650 12/19/18 0720 12/22/18 0729 12/23/18 0700  GLUCAP 107* 118* 130* 120* 118*   Lipid Profile: No results for input(s): CHOL, HDL, LDLCALC, TRIG, CHOLHDL, LDLDIRECT in the last 72 hours. Thyroid Function Tests: No results for input(s): TSH, T4TOTAL, FREET4, T3FREE, THYROIDAB in the last 72 hours. Anemia Panel: No results for input(s): VITAMINB12, FOLATE, FERRITIN, TIBC, IRON, RETICCTPCT in the last 72 hours. Sepsis Labs: No results for input(s): PROCALCITON, LATICACIDVEN in the last 168 hours.  Recent Results (from the past 240 hour(s))  Surgical pcr screen     Status: None   Collection Time: 12/15/18  9:42 AM  Result Value Ref Range Status   MRSA, PCR NEGATIVE NEGATIVE Final   Staphylococcus aureus NEGATIVE NEGATIVE Final    Comment: (NOTE) The Xpert SA Assay (FDA approved for NASAL specimens in patients 12 years of age and older), is one component of a comprehensive surveillance program. It is not intended to diagnose infection nor to guide  or monitor treatment. Performed at [facility], [place] [address], [place], [place] [address]   Novel Coronavirus, NAA (hospital order; send-out to ref lab)     Status: None   Collection Time: 12/15/18 10:13 AM  Result Value Ref Range Status   SARS-CoV-2, NAA NOT DETECTED NOT DETECTED Final    Comment: (NOTE) Testing was performed using the cobas(R) SARS-CoV-2 test. This test was developed and its performance characteristics determined by [employer]. This test has not been FDA cleared or approved. This test has been authorized by FDA under an Emergency Use Authorization (EUA). This test is only authorized for the duration of time the declaration that circumstances  exist justifying the authorization of the emergency use of in vitro diagnostic tests for detection of SARS-CoV-2 virus and/or diagnosis of COVID-19 infection under section 564(b)(1) of the Act, 21 U.S.C. 681EXN-1(Z)(0), unless the authorization is terminated or revoked sooner. When diagnostic testing is negative, the possibility of a false negative result should be considered in the context of a patient's recent exposures and the presence of clinical signs and symptoms consistent with COVID-19. An individual without symptoms of COVID-19 and who is not shedding SARS-CoV-2 virus would expect to have  a negative (not detected) result in this assay. Performed At: [facility] [address] [place], [place] [id number] Rush Farmer MD PR:[id number]    [place]  Final    Comment: Performed at [facility], [place] [address] [place], [place] [address]         Radiology Studies: Dg Chest Port 1 View  Result Date: 12/21/2018 CLINICAL DATA:  Dyspnea EXAM: PORTABLE CHEST 1 VIEW COMPARISON:  Earlier today at 10 a.m. FINDINGS: Stable left upper lobe mass. There is persistent volume loss in the left lung. No pneumothorax. Right lung is clear. Normal heart size. IMPRESSION: Stable left upper lobe mass.  No pneumothorax. Electronically Signed   By: Marybelle Killings M.D.   On: 12/21/2018 13:05        Scheduled Meds:  feeding supplement  1 Container Oral TID WC   feeding supplement (ENSURE ENLIVE)  237 mL Oral Daily   levothyroxine  100 mcg Oral QAC breakfast   multivitamin with minerals  1 tablet Oral Daily   sodium chloride flush  3 mL Intravenous Q12H   Continuous Infusions:    LOS: 6 days   Time spent= 35 mins    Kennet Mccort Arsenio Loader, MD [facility]  If 7PM-7AM, please contact night-coverage [email or web address] 12/23/2018, 11:30 AM

## 2018-12-24 ENCOUNTER — Other Ambulatory Visit (HOSPITAL_COMMUNITY): Payer: Self-pay | Admitting: *Deleted

## 2018-12-24 ENCOUNTER — Ambulatory Visit (HOSPITAL_COMMUNITY): Payer: BC Managed Care – PPO | Admitting: Hematology

## 2018-12-24 DIAGNOSIS — C3412 Malignant neoplasm of upper lobe, left bronchus or lung: Secondary | ICD-10-CM

## 2018-12-24 LAB — CBC
HCT: 29.9 % — ABNORMAL LOW (ref 36.0–46.0)
Hemoglobin: 9.7 g/dL — ABNORMAL LOW (ref 12.0–15.0)
MCH: 29 pg (ref 26.0–34.0)
MCHC: 32.4 g/dL (ref 30.0–36.0)
MCV: 89.5 fL (ref 80.0–100.0)
Platelets: 330 10*3/uL (ref 150–400)
RBC: 3.34 MIL/uL — ABNORMAL LOW (ref 3.87–5.11)
RDW: 13.1 % (ref 11.5–15.5)
WBC: 12.7 10*3/uL — ABNORMAL HIGH (ref 4.0–10.5)
nRBC: 0 % (ref 0.0–0.2)

## 2018-12-24 LAB — BASIC METABOLIC PANEL
Anion gap: 12 (ref 5–15)
BUN: 9 mg/dL (ref 8–23)
CO2: 26 mmol/L (ref 22–32)
Calcium: 8.1 mg/dL — ABNORMAL LOW (ref 8.9–10.3)
Chloride: 103 mmol/L (ref 98–111)
Creatinine, Ser: 0.84 mg/dL (ref 0.44–1.00)
GFR calc Af Amer: >60 mL/min (ref >60)
GFR calc non Af Amer: >60 mL/min (ref >60)
Glucose, Bld: 106 mg/dL — ABNORMAL HIGH (ref 70–99)
Potassium: 3.7 mmol/L (ref 3.5–5.1)
Sodium: 141 mmol/L (ref 135–145)

## 2018-12-24 LAB — GLUCOSE, CAPILLARY: Glucose-Capillary: 98 mg/dL (ref 70–99)

## 2018-12-24 LAB — MAGNESIUM: Magnesium: 1.8 mg/dL (ref 1.7–2.4)

## 2018-12-24 MED ORDER — ELIQUIS 5 MG VTE STARTER PACK: ORAL_TABLET | ORAL | 0 refills | Status: AC

## 2018-12-24 MED ORDER — ALPRAZOLAM 0.5 MG PO TABS: 0.5000 mg | ORAL_TABLET | Freq: Three times a day (TID) | ORAL | 0 refills | Status: AC | PRN

## 2018-12-24 MED ORDER — POLYETHYLENE GLYCOL 3350 17 G PO PACK: 17.0000 g | PACK | Freq: Every day | ORAL | 0 refills | Status: AC | PRN

## 2018-12-24 MED ORDER — HYDROCODONE-ACETAMINOPHEN 5-325 MG PO TABS: 1.0000 | ORAL_TABLET | Freq: Four times a day (QID) | ORAL | 0 refills | Status: AC | PRN

## 2018-12-24 MED FILL — POLYETHYLENE GLYCOL 3350 PO: 17 | 14 days supply | Qty: 238 | Fill #0

## 2018-12-24 MED FILL — ELIQUIS STARTER PACK 5 MG T: 5 | 30 days supply | Qty: 74 | Fill #0

## 2018-12-24 MED FILL — ALPRAZolam 0.5 MG TABS: 0.5 | 4 days supply | Qty: 10 | Fill #0

## 2018-12-24 NOTE — Progress Notes (Signed)
Orders received from Dr. Delton Coombes for CT biopsy right ilium mets.  Patient aware that they will be called with appointment.

## 2018-12-24 NOTE — Progress Notes (Signed)
DISCHARGE NOTE [facility] to be discharged to Home per MD order. Patient verbalized understanding.  Skin clean, dry and intact without evidence of skin break down, no evidence of skin tears noted. IV catheter discontinued intact. Site without signs and symptoms of complications. Dressing and pressure applied. Pt denies pain at the site currently. No complaints noted.  Patient free of lines, drains, and wounds.   Discharge packet assembled. Prescription med with patient. An After Visit Summary (AVS) was printed and given to the Patient at bedside. Patient escorted via wheelchair and discharged to designated family member. All questions and concerns addressed.   Dolores Hoose, RN

## 2018-12-24 NOTE — Discharge Summary (Signed)
Physician Discharge Summary  Yolanda Miller SWF:[id number] DOB: 05-21-1956 DOA: 12/16/2018  PCP: [facility] date: 12/16/2018 Discharge date: 12/24/2018  Admitted From: Home Disposition: Home  Recommendations for Outpatient Follow-up:  1. Follow up with PCP in 1-2 weeks 2. Please obtain BMP/CBC in one week your next doctors visit.  3. Eliquis starter pack ordered for renal thrombosis 4. Follow outpatient oncology  Discharge Condition: Stable CODE STATUS: Full code Diet recommendation: Regular  Brief/Interim Summary: 63 year old with past medical history of hypothyroidism and metastatic unknown came to the hospital with complaints of left-sided flank pain.  CT showed left-sided renal infarct with concerns of right-sided abdominal infarction 2.  Started on heparin drip.  Bronchoscopy performed on 5/26 by CT surgery.  Results are concerning for malignant cells, type of malignancy.  Patient was started on heparin drip initially for renal thrombosis later switched over to Eliquis.  Patient also had leukocytosis during hospitalization but remained afebrile and trended down on its own therefore antibiotics were never started.  Today patient remains hemodynamically stable and stable for discharge with outpatient follow-up with oncology.   Discharge Diagnoses:  Principal Problem:   Renal infarction [facility]) Active Problems:   Hypothyroidism   Mass of upper lobe of left lung   Renal arterial thrombosis (HCC)   Cancer-related pain   Mild renal insufficiency   Renal infarct (HCC)   Non-small cell carcinoma of lung (HCC)  Bilateral renal infarction, left-sided renal thrombosis, acute Left sided abd pain, secondary to infarction.  -  Status post bronchoscopy.  We will transition heparin drip to Eliquis at the time of discharge today.  Patient needs to follow-up with Dr. Delton Coombes from oncology outpatient. -Hypercoagulable state secondary to malignancy  Stage  IV metastatic cancer, unknown -Likely non-small cell lung cancer.  Status post bronchoscopy with biopsy on 5/26.  Biopsy results concerning for malignancy/positive malignant cells.  Patient also has metastatic bone disease. -Very high risk for fracture, advised to use crutches. -Follow outpatient oncology  Leukocytosis- not sure of the exact etiology, no obvious evidence of infection. Monitor off Abx. If spikes a fever, will start Abx.   Acute kidney injury resolved with IV fluids  Anemia of chronic disease -No evidence of bleeding.  Hypothyroidism -Continue Synthroid  Tobacco use -Counseled to quit using tobacco.  Consultations:  CT surgery for bronchoscopy  Subjective: Feeling well, no complaints.  Wishes to go home.  Discharge Exam: Vitals:   12/24/18 0514 12/24/18 0916  BP: 129/78 (!) 147/82  Pulse: 83 77  Resp: 18 18  Temp: 99.6 F (37.6 C) 98.3 F (36.8 C)  SpO2: 94% 96%   Vitals:   12/23/18 1748 12/23/18 2007 12/24/18 0514 12/24/18 0916  BP: (!) 160/86 (!) 154/82 129/78 (!) 147/82  Pulse: 79 83 83 77  Resp: 18 18 18 18   Temp: 98.7 F (37.1 C) 98.9 F (37.2 C) 99.6 F (37.6 C) 98.3 F (36.8 C)  TempSrc: Oral Oral Oral Oral  SpO2: 96% 92% 94% 96%  Weight:  62.4 kg    Height:        General: Pt is alert, awake, not in acute distress Cardiovascular: RRR, S1/S2 +, no rubs, no gallops Respiratory: CTA bilaterally, no wheezing, no rhonchi Abdominal: Soft, NT, ND, bowel sounds + Extremities: no edema, no cyanosis  Discharge Instructions  Discharge Instructions    Diet - low sodium heart healthy   Complete by:  As directed    Increase activity slowly   Complete  by:  As directed      Allergies as of 12/24/2018      Reactions   Demerol [meperidine] Itching   Oxycodone-acetaminophen Itching, Rash   Propoxyphene N-acetaminophen Itching, Rash      Medication List    TAKE these medications   albuterol 108 (90 Base) MCG/ACT inhaler Commonly  known as:  VENTOLIN HFA Inhale 1-2 puffs into the lungs every 6 (six) hours as needed for wheezing or shortness of breath.   ALPRAZolam 0.5 MG tablet Commonly known as:  XANAX Take 1 tablet (0.5 mg total) by mouth 3 (three) times daily as needed for up to 10 doses for anxiety.   cholecalciferol 1000 units tablet Commonly known as:  VITAMIN D Take 1,000 Units by mouth daily.   doxylamine (Sleep) 25 MG tablet Commonly known as:  UNISOM Take 25 mg by mouth at bedtime as needed for sleep.   Eliquis DVT/PE Starter Pack 5 MG Tabs Take as directed on package: start with two-5mg  tablets twice daily for 7 days. On day 8, switch to one-5mg  tablet twice daily.   HYDROcodone-acetaminophen 5-325 MG tablet Commonly known as:  NORCO/VICODIN Take 1 tablet by mouth every 6 (six) hours as needed for moderate pain.   HYDROmorphone 2 MG tablet Commonly known as:  Dilaudid Take 1 tablet (2 mg total) by mouth every 4 (four) hours as needed for severe pain.   hydrOXYzine 25 MG tablet Commonly known as:  ATARAX/VISTARIL Take 25 mg by mouth every 8 (eight) hours as needed for anxiety.   ibuprofen 200 MG tablet Commonly known as:  ADVIL Take 400 mg by mouth every 6 (six) hours as needed for headache or moderate pain.   levothyroxine 100 MCG tablet Commonly known as:  SYNTHROID Take 100 mcg by mouth daily before breakfast.   polyethylene glycol 17 g packet Commonly known as:  MIRALAX / GLYCOLAX Take 17 g by mouth daily as needed for mild constipation.      Follow-up Information    [facility] Schedule an appointment as soon as possible for a visit in 2 week(s).   Contact information: [address] [place] [address] [phone]          Allergies  Allergen Reactions  . Demerol [Meperidine] Itching  . Oxycodone-Acetaminophen Itching and Rash  . Propoxyphene N-Acetaminophen Itching and Rash    You were cared for by a hospitalist during your hospital  stay. If you have any questions about your discharge medications or the care you received while you were in the hospital after you are discharged, you can call the unit and asked to speak with the hospitalist on call if the hospitalist that took care of you is not available. Once you are discharged, your primary care physician will handle any further medical issues. Please note that no refills for any discharge medications will be authorized once you are discharged, as it is imperative that you return to your primary care physician (or establish a relationship with a primary care physician if you do not have one) for your aftercare needs so that they can reassess your need for medications and monitor your lab values.   Procedures/Studies: Dg Chest 2 View  Result Date: 12/21/2018 CLINICAL DATA:  Lung biopsy EXAM: CHEST - 2 VIEW COMPARISON:  12/15/2018 FINDINGS: Again noted is a left upper lobe lung mass. There has been interval development of a left-sided pleural effusion and probable left lower lobe atelectasis. No pneumothorax. No acute osseous abnormality.  IMPRESSION: 1. Persistent large left upper lobe lung mass. 2. Interval development of a small left-sided pleural effusion with adjacent atelectasis. Electronically Signed   By: Constance Holster M.D.   On: 12/21/2018 00:41   Dg Chest 2 View  Result Date: 12/15/2018 CLINICAL DATA:  Video bronchoscopy. EXAM: CHEST - 2 VIEW COMPARISON:  CT chest dated 11/24/2018. Chest x-ray dated 11/04/2018. FINDINGS: Again noted is a left upper lobe mass. This mass is better appreciated on prior CT and PET-CT. The mass appears grossly stable in size from prior chest x-ray. There is no pneumothorax. No large pleural effusion. The right lung field is essentially clear. There is no acute osseous abnormality. IMPRESSION: Stable appearance of the chest with a persistent left upper lobe mass, better evaluated on prior PET-CT and diagnostic contrast-enhanced CT.  Electronically Signed   By: Constance Holster M.D.   On: 12/15/2018 14:31   Ct Chest W Contrast  Result Date: 11/24/2018 CLINICAL DATA:  Left upper lobe lesion on chest x-ray. EXAM: CT CHEST WITH CONTRAST TECHNIQUE: Multidetector CT imaging of the chest was performed during intravenous contrast administration. CONTRAST:  85mL OMNIPAQUE IOHEXOL 300 MG/ML  SOLN COMPARISON:  Chest x-ray 11/04/2018 FINDINGS: Cardiovascular: The heart size is normal. Tiny pericardial effusion. Coronary artery calcification is evident. Atherosclerotic calcification is noted in the wall of the thoracic aorta. Mediastinum/Nodes: Large confluent mediastinal mass is identified involving the left hilum and AP window. At the level of the AP window, the lesion measures 5.4 x 5.7 cm. As it tracks inferiorly into the left hilum, the left main pulmonary artery is markedly attenuated by the disease, narrowing the lumen down to about 3 x 3 mm diameter. Left upper lobe pulmonary artery is obliterated. Subcarinal lymphadenopathy measures up to 13 mm short axis. No right hilar adenopathy. No axillary or supraclavicular lymphadenopathy. The esophagus has normal imaging features. Lungs/Pleura: 7.8 x 4.5 cm soft tissue mass is identified in the medial left upper lobe with associated left upper lobe collapse. 12 mm irregular nodular opacity is identified in the right middle lobe (83/4). No right pleural effusion. 4 mm irregular nodule identified inferior lingula (97/4). 7 mm irregular nodule identified in the left base (109/4). No left pleural effusion. Upper Abdomen: Haziness is identified in the fat around the upper abdominal aorta and in the hepato duodenal ligament. 11 mm left adrenal nodule is well seen on coronal image 65 of series 5. Musculoskeletal: No worrisome lytic or sclerotic osseous abnormality. IMPRESSION: 1. Large medial left upper lobe pulmonary mass with associated left hilar, subcarinal and AP window bulky nodal/soft tissue disease.  Left hilar adenopathy circumferentially encases and dramatically attenuates the left main pulmonary artery and obliterates apicoposterior and anterior bronchi to the left upper lobe. 2. Apical left upper lobe collapse. 3. Haziness with small lymph nodes identified in the upper abdomen with 11 mm left adrenal nodule associated. Dedicated abdomen and pelvis CT may be warranted to assess for metastatic disease. 4.  Aortic Atherosclerois (ICD10-170.0) These results will be called to the ordering clinician or representative by the Radiologist Assistant, and communication documented in the PACS or zVision Dashboard. Electronically Signed   By: Misty Stanley M.D.   On: 11/24/2018 17:50   Mr Jeri Cos FX Contrast  Result Date: 12/01/2018 CLINICAL DATA:  Weakness, difficulty walking, low back pain. New diagnosis of lung cancer. EXAM: MRI HEAD WITHOUT AND WITH CONTRAST TECHNIQUE: Multiplanar, multiecho pulse sequences of the brain and surrounding structures were obtained without and with intravenous contrast. CONTRAST:  Gadavist 6 mL. COMPARISON:  None. FINDINGS: The patient was unable to remain motionless for the exam. Small or subtle lesions could be overlooked. Brain: No evidence for acute infarction, hemorrhage, mass lesion, hydrocephalus, or extra-axial fluid. Normal for age cerebral volume. Minor T2 and FLAIR hyperintensities in the periventricular and subcortical white matter likely early small vessel disease. Post infusion, no abnormal enhancement of the brain or meninges. Vascular: Flow voids are maintained throughout the carotid, basilar, and vertebral arteries. There are no areas of chronic hemorrhage. Skull and upper cervical spine: Unremarkable visualized calvarium, skullbase, and cervical vertebrae. Pituitary, pineal, cerebellar tonsils unremarkable. No upper cervical cord lesions. Sinuses/Orbits: No orbital masses or proptosis. Globes appear symmetric. Sinuses appear well aerated, without evidence for  air-fluid level. Other: Unremarkable visualized calvarium, skullbase, and cervical vertebrae. Pituitary, pineal, cerebellar tonsils unremarkable. No upper cervical cord lesions. IMPRESSION: Within limits for detection due to patient motion, no acute intracranial findings. No visible intracranial metastatic disease is observed. No visible osseous disease. Electronically Signed   By: Staci Righter M.D.   On: 12/01/2018 13:59   Mr Lumbar Spine W Wo Contrast  Result Date: 12/01/2018 CLINICAL DATA:  Patient recently diagnosed with lung cancer. Low back pain and difficulty walking. EXAM: MRI LUMBAR SPINE WITHOUT AND WITH CONTRAST TECHNIQUE: Multiplanar and multiecho pulse sequences of the lumbar spine were obtained without and with intravenous contrast. CONTRAST:  6 cc Gadavist IV. COMPARISON:  Plain films lumbar spine 11/05/2018. MRI lumbar spine 08/02/2004. FINDINGS: Segmentation:  Standard. Alignment:  Maintained. Vertebrae: Negative for metastatic disease. No fracture or focal lesion. Conus medullaris and cauda equina: Conus extends to the L1 level. Conus and cauda equina appear normal. Paraspinal and other soft tissues: The patient has a large metastatic deposit in the right ilium adjacent to the SI joint. There is associated cortical destructive change and extension into the soft tissues. The lesion is incompletely imaged. Disc levels: T11-12 and T12-L1 are imaged in the sagittal plane only and negative. L1-2: Negative. L2-3: Shallow disc bulge without stenosis. L3-4: New shallow disc bulge more prominent to the left and ligamentum flavum thickening. There is mild central canal and left foraminal narrowing. L4-5: Broad-based disc bulge and ligamentum flavum thickening cause moderate central canal stenosis and some narrowing in the subarticular recesses. The right foramen is open. Mild left foraminal narrowing noted. Spondylosis has worsened since the prior MRI. L5-S1: Shallow disc bulge without stenosis.  IMPRESSION: Large metastatic deposit in the posterior right ilium with extension into the soft tissues is incompletely imaged on this examination. If indicated, dedicated MRI of the pelvis with and without contrast could be used for further evaluation. Negative for metastatic disease of the lumbar spine. Progressive spondylosis at L4-5 where there is moderate central canal and mild bilateral subarticular recess narrowing. Shallow disc bulge to the left at L3-4 causes mild central canal and left foraminal narrowing. The bulge is new since the prior MRI. Electronically Signed   By: Inge Rise M.D.   On: 12/01/2018 13:52   Ct Abdomen Pelvis W Contrast  Result Date: 12/16/2018 CLINICAL DATA:  Left lower quadrant pain with nausea and vomiting EXAM: CT ABDOMEN AND PELVIS WITH CONTRAST TECHNIQUE: Multidetector CT imaging of the abdomen and pelvis was performed using the standard protocol following bolus administration of intravenous contrast. CONTRAST:  159mL OMNIPAQUE IOHEXOL 300 MG/ML  SOLN COMPARISON:  PET CT 12/08/2018 FINDINGS: Lower chest: Lung bases demonstrate no acute consolidation or effusion. The heart size is normal. Partially visualized small pericardial effusion  measuring up to 9 mm in thickness. Hepatobiliary: No focal liver abnormality is seen. No gallstones, gallbladder wall thickening, or biliary dilatation. Pancreas: Unremarkable. No pancreatic ductal dilatation or surrounding inflammatory changes. Spleen: Normal in size without focal abnormality. Adrenals/Urinary Tract: Right adrenal gland is normal. Slightly nodular left adrenal gland without well-defined mass. No hydronephrosis. Wedge-shaped hypodensity within the mid right kidney and lower pole of the right kidney, suspect for infarcts. Diffuse hypoenhancement of the left kidney with minimal foci of cortical enhancement, consistent with infarcted left kidney. Occlusive thrombus within the distal left renal artery. Renal vein appears to  enhance normally. The bladder is unremarkable Stomach/Bowel: Stomach is within normal limits. Appendix appears normal. No evidence of bowel wall thickening, distention, or inflammatory changes. Vascular/Lymphatic: Nonaneurysmal aorta. Moderate aortic atherosclerosis. Subcentimeter retroperitoneal lymph nodes Reproductive: Status post hysterectomy. No adnexal masses. Other: No free air. Thickening in the left gutter with 9 mm intraperitoneal nodule. Musculoskeletal: Large metastatic lesion involving the right iliac bone. Soft tissue component extends into the right gluteal musculature. IMPRESSION: 1. Diffuse hypoenhancement of the left kidney with small foci of cortical sparing, findings are consistent with diffuse infarction of the left kidney. There is occlusive thrombus present within the distal left renal artery. Right kidney demonstrates small foci of cortical wedge-shaped hypodensity, also suspect for renal infarcts. 2. Left lower quadrant 9 mm peritoneal nodule consistent with metastatic disease 3. Large metastatic lesion involving the right iliac bone. 4. Small pericardial effusion Electronically Signed   By: Donavan Foil M.D.   On: 12/16/2018 23:09   Nm Pet Image Initial (pi) Skull Base To Thigh  Result Date: 12/09/2018 CLINICAL DATA:  Initial treatment strategy for lung mass. EXAM: NUCLEAR MEDICINE PET SKULL BASE TO THIGH TECHNIQUE: 7.0 mCi F-18 FDG was injected intravenously. Full-ring PET imaging was performed from the skull base to thigh after the radiotracer. CT data was obtained and used for attenuation correction and anatomic localization. Fasting blood glucose: 104 mg/dl COMPARISON:  CT 11/24/2018 FINDINGS: Mediastinal blood pool activity: SUV max 2.2 Liver activity: SUV max NA NECK: No hypermetabolic lymph nodes in the neck. Incidental CT findings: none CHEST: Hypermetabolic RIGHT AP window mass surrounding the LEFT pulmonary artery is intensely hypermetabolic with SUV max equal 30.9. Mass  measures approximately 6.5 by 5.5 cm. There is collapse of the LEFT upper lobe secondary to AP window mass surrounding the upper lobe bronchus. In the periphery of this collapsed LEFT upper lobe there is intense hypermetabolic focus along the anterior chest wall SUV max equal 27.4. This hypermetabolic region measures approximately 4 cm. No clear evidence of extension through the chest wall. Mass abuts the pleural surface. No hypermetabolic supraclavicular nodes. Within the RIGHT middle lobe elongated nodule measuring 13 mm (image 77/4) has associated metabolic activity with SUV max equal 3.7. Incidental CT findings: none ABDOMEN/PELVIS: No abnormal hypermetabolic activity within the liver, pancreas, adrenal glands, or spleen. No hypermetabolic lymph nodes in the abdomen or pelvis. Hypermetabolic focus in the LEFT abdomen peritoneal space along the anti mesenteric border of the descending colon corresponds to peritoneal nodule measuring 7 mm (image 129/4) with SUV max equal 5.8. No additional evidence peritoneal metastasis. Incidental CT findings: SKELETON: Large expansile lytic lesion in the RIGHT iliac bone measures up to 6 cm with SUV max equal 15.5. There is soft tissue expansion of the bone lesion abutting the RIGHT gluteus muscle. Incidental CT findings: none IMPRESSION: 1. Hypermetabolic LEFT suprahilar/AP window mass with intense metabolic activity is most suggestive of small cell lung  cancer. 2. Hypermetabolic focus in the periphery of the collapsed LEFT upper lobe likely represents the primary bronchogenic carcinoma. 3. Evidence of contralateral lung metastasis in the RIGHT middle lobe. 4. Solitary peritoneal metastasis along the descending colon. 5. Skeletal metastasis with solitary large expansile lesion in the RIGHT iliac bone. 6. Stage IV carcinoma. Electronically Signed   By: Suzy Bouchard M.D.   On: 12/09/2018 09:34   Dg Chest Port 1 View  Result Date: 12/21/2018 CLINICAL DATA:  Dyspnea EXAM:  PORTABLE CHEST 1 VIEW COMPARISON:  Earlier today at 10 a.m. FINDINGS: Stable left upper lobe mass. There is persistent volume loss in the left lung. No pneumothorax. Right lung is clear. Normal heart size. IMPRESSION: Stable left upper lobe mass.  No pneumothorax. Electronically Signed   By: Marybelle Killings M.D.   On: 12/21/2018 13:05   Dg Chest Port 1 View  Result Date: 12/21/2018 CLINICAL DATA:  Post bronchoscopy. EXAM: PORTABLE CHEST 1 VIEW COMPARISON:  Radiographs 12/21/2018.  CT 11/24/2018. FINDINGS: The heart size and mediastinal contours are stable. The aortic arch remains obscured by the left upper lobe mass and associated volume loss. Patchy left basilar atelectasis and a small left pleural effusion are unchanged. There is no pneumothorax. The right lung is clear. No acute osseous findings. IMPRESSION: No demonstrated complication or significant changes following bronchoscopy. Persistent left upper lobe mass and collapse. Electronically Signed   By: Richardean Sale M.D.   On: 12/21/2018 10:15      The results of significant diagnostics from this hospitalization (including imaging, microbiology, ancillary and laboratory) are listed below for reference.     Microbiology: Recent Results (from the past 240 hour(s))  Surgical pcr screen     Status: None   Collection Time: 12/15/18  9:42 AM  Result Value Ref Range Status   MRSA, PCR NEGATIVE NEGATIVE Final   Staphylococcus aureus NEGATIVE NEGATIVE Final    Comment: (NOTE) The Xpert SA Assay (FDA approved for NASAL specimens in patients 60 years of age and older), is one component of a comprehensive surveillance program. It is not intended to diagnose infection nor to guide or monitor treatment. Performed at [facility], [place] [address], [place], [place] [address]   Novel Coronavirus, NAA (hospital order; send-out to ref lab)     Status: None   Collection Time: 12/15/18 10:13 AM  Result Value Ref Range Status    SARS-CoV-2, NAA NOT DETECTED NOT DETECTED Final    Comment: (NOTE) Testing was performed using the cobas(R) SARS-CoV-2 test. This test was developed and its performance characteristics determined by [employer]. This test has not been FDA cleared or approved. This test has been authorized by FDA under an Emergency Use Authorization (EUA). This test is only authorized for the duration of time the declaration that circumstances exist justifying the authorization of the emergency use of in vitro diagnostic tests for detection of SARS-CoV-2 virus and/or diagnosis of COVID-19 infection under section 564(b)(1) of the Act, 21 U.S.C. 858IFO-2(D)(7), unless the authorization is terminated or revoked sooner. When diagnostic testing is negative, the possibility of a false negative result should be considered in the context of a patient's recent exposures and the presence of clinical signs and symptoms consistent with COVID-19. An individual without symptoms of COVID-19 and who is not shedding SARS-CoV-2 virus would expect to have  a negative (not detected) result in this assay. Performed At: [facility] [place], [place] [id number] Rush Farmer MD HM:[id number]  Coronavirus Source NASOPHARYNGEAL  Final    Comment: Performed at [facility], [place] [address] [place], [place] [address]     Labs: BNP (last 3 results) No results for input(s): BNP in the last 8760 hours. Basic Metabolic Panel: Recent Labs  Lab 12/20/18 0424 12/21/18 0336 12/22/18 0421 12/23/18 0032 12/24/18 0607  NA 140 140 143 142 141  K 3.9 3.6 3.4* 3.2* 3.7  CL 107 111 106 104 103  CO2 24 22 25 26 26   GLUCOSE 116* 102* 131* 104* 106*  BUN 7* 5* 8 12 9   CREATININE 1.06* 0.97 1.16* 0.99 0.84  CALCIUM 8.0* 8.0* 8.1* 7.9* 8.1*  MG  --   --   --  2.0 1.8   Liver Function Tests: Recent Labs  Lab 12/21/18 0336  AST 14*  ALT 37  ALKPHOS 78  BILITOT 0.4  PROT 5.5*  ALBUMIN  2.4*   No results for input(s): LIPASE, AMYLASE in the last 168 hours. No results for input(s): AMMONIA in the last 168 hours. CBC: Recent Labs  Lab 12/20/18 0424 12/21/18 0336 12/22/18 0421 12/23/18 0032 12/24/18 0607  WBC 10.0 9.5 16.4* 17.3* 12.7*  HGB 9.7* 9.0* 8.9* 9.7* 9.7*  HCT 29.9* 27.9* 27.5* 29.6* 29.9*  MCV 89.8 89.7 89.0 89.2 89.5  PLT 218 229 273 341 330   Cardiac Enzymes: No results for input(s): CKTOTAL, CKMB, CKMBINDEX, TROPONINI in the last 168 hours. BNP: Invalid input(s): POCBNP CBG: Recent Labs  Lab 12/18/18 0650 12/19/18 0720 12/22/18 0729 12/23/18 0700 12/24/18 0701  GLUCAP 118* 130* 120* 118* 98   D-Dimer No results for input(s): DDIMER in the last 72 hours. Hgb A1c No results for input(s): HGBA1C in the last 72 hours. Lipid Profile No results for input(s): CHOL, HDL, LDLCALC, TRIG, CHOLHDL, LDLDIRECT in the last 72 hours. Thyroid function studies No results for input(s): TSH, T4TOTAL, T3FREE, THYROIDAB in the last 72 hours.  Invalid input(s): FREET3 Anemia work up No results for input(s): VITAMINB12, FOLATE, FERRITIN, TIBC, IRON, RETICCTPCT in the last 72 hours. Urinalysis    Component Value Date/Time   COLORURINE YELLOW 12/16/2018 2130   APPEARANCEUR HAZY (A) 12/16/2018 2130   LABSPEC 1.027 12/16/2018 2130   PHURINE 5.0 12/16/2018 2130   GLUCOSEU NEGATIVE 12/16/2018 2130   HGBUR NEGATIVE 12/16/2018 2130   BILIRUBINUR NEGATIVE 12/16/2018 2130   KETONESUR 20 (A) 12/16/2018 2130   PROTEINUR 100 (A) 12/16/2018 2130   UROBILINOGEN 4.0 (H) 12/21/2011 2335   NITRITE NEGATIVE 12/16/2018 2130   LEUKOCYTESUR NEGATIVE 12/16/2018 2130   Sepsis Labs Invalid input(s): PROCALCITONIN,  WBC,  LACTICIDVEN Microbiology Recent Results (from the past 240 hour(s))  Surgical pcr screen     Status: None   Collection Time: 12/15/18  9:42 AM  Result Value Ref Range Status   MRSA, PCR NEGATIVE NEGATIVE Final   Staphylococcus aureus NEGATIVE NEGATIVE  Final    Comment: (NOTE) The Xpert SA Assay (FDA approved for NASAL specimens in patients 62 years of age and older), is one component of a comprehensive surveillance program. It is not intended to diagnose infection nor to guide or monitor treatment. Performed at [facility], [place] [address], [place], [place] [address]   Novel Coronavirus, NAA (hospital order; send-out to ref lab)     Status: None   Collection Time: 12/15/18 10:13 AM  Result Value Ref Range Status   SARS-CoV-2, NAA NOT DETECTED NOT DETECTED Final    Comment: (NOTE) Testing was performed using the cobas(R)  SARS-CoV-2 test. This test was developed and its performance characteristics determined by [employer]. This test has not been FDA cleared or approved. This test has been authorized by FDA under an Emergency Use Authorization (EUA). This test is only authorized for the duration of time the declaration that circumstances exist justifying the authorization of the emergency use of in vitro diagnostic tests for detection of SARS-CoV-2 virus and/or diagnosis of COVID-19 infection under section 564(b)(1) of the Act, 21 U.S.C. 008QPY-1(P)(5), unless the authorization is terminated or revoked sooner. When diagnostic testing is negative, the possibility of a false negative result should be considered in the context of a patient's recent exposures and the presence of clinical signs and symptoms consistent with COVID-19. An individual without symptoms of COVID-19 and who is not shedding SARS-CoV-2 virus would expect to have  a negative (not detected) result in this assay. Performed At: [facility] [address] [place], [place] [id number] Rush Farmer MD PY:[id number]    [place]  Final    Comment: Performed at [facility], [place] [address] [place], [place] [address]     Time coordinating discharge:  I have spent 35 minutes face to face with the  patient and on the ward discussing the patients care, assessment, plan and disposition with other care givers. >50% of the time was devoted counseling the patient about the risks and benefits of treatment/Discharge disposition and coordinating care.   SIGNED:   Damita Lack, MD  [facility] 12/24/2018, 10:40 AM   If 7PM-7AM, please contact night-coverage [email or web address]

## 2018-12-27 ENCOUNTER — Other Ambulatory Visit (HOSPITAL_COMMUNITY): Payer: Self-pay | Admitting: *Deleted

## 2018-12-27 ENCOUNTER — Encounter (HOSPITAL_COMMUNITY): Payer: Self-pay | Admitting: *Deleted

## 2018-12-27 MED ORDER — HYDROMORPHONE HCL 2 MG PO TABS: 2.0000 mg | ORAL_TABLET | ORAL | 0 refills | Status: AC | PRN

## 2018-12-27 NOTE — Op Note (Signed)
NAME: Yolanda Miller, Yolanda Miller MEDICAL RECORD [id number] ACCOUNT [id number] DATE OF BIRTH:[birth year] FACILITY: [facility] LOCATION: [facility] PHYSICIAN:Rogan Ecklund C. Lidie Glade, MD  OPERATIVE REPORT  DATE OF PROCEDURE:  12/21/2018  PREOPERATIVE DIAGNOSIS:  Left hilar mass, suspect stage IV lung cancer.  POSTOPERATIVE DIAGNOSIS:  Stage IV lung cancer, probable nonsmall cell.  PROCEDURE:  Bronchoscopy with brushings and endobronchial biopsies and endobronchial ultrasound with needle aspirations.  SURGEON:  Modesto Charon, MD  ASSISTANT:  None.  ANESTHESIA:  General.  FINDINGS:  Narrowing at left main stem due to extrinsic compression.  Endobronchial mass with complete occlusion of the anterior-apical-posterior segmental bronchus.  Brushings- Quick prep, likely nonsmall-cell carcinoma.  CLINICAL NOTE:  [name] is a 63 year old woman with a history of tobacco abuse who initially presented with cough, hoarseness and shortness of breath.  She was treated for possible pneumonia, but a followup film showed persistent atelectasis.  On CT,  there was a large left hilar mass and mediastinal mass.  On PET CT, these areas were markedly hypermetabolic.  There also was evidence of a right pelvic metastasis and a retroperitoneal metastasis.  She was originally scheduled for bronchoscopy on  12/17/2018 but presented the evening before with a renal infarction due to renal artery thrombosis.  She was treated medically for that, and bronchoscopy was rescheduled for 12/21/2018.  The indications, risks, benefits, and alternatives were  discussed in detail with the patient.  She understood and accepted the risks and agreed to proceed.  OPERATIVE NOTE:  [name] was brought to the operating room on 12/21/2018.  She had induction of general anesthesia and was intubated.  Sequential compression devices were in place for DVT prophylaxis.  A timeout was performed.  Flexible  fiberoptic bronchoscopy was performed via  the endotracheal tube.  There was a narrowing of the left main stem bronchus due to extrinsic compression.  The bronchoscope did pass this area. The left main stem itself was relatively normal beyond that  point.  At the takeoff of the left upper lobe bronchus, there was an endobronchial mass lesion completely occluding the anterior-apical-posterior segmental bronchus.  The lingular bronchus was patent.  The bronchoscope was removed.  Endobronchial ultrasound probe was advanced.  Needle aspirations were performed of the 4L node at the origin of the left main stem bronchus as well as a level 7 node.  Quick prep showed suspicious cells from the 4L node.  The level 7 node showed lymphoid cells,  but no tumor was seen.  The bronchoscope was reinserted.  It was advanced to the left upper lobe bronchus.  Brushings were performed followed by biopsies.  Multiple biopsies were taken in hopes of having sufficient tissue for molecular testing and definitive diagnosis.  There  was some bleeding with biopsies, which was controlled with irrigation with saline and diluted epinephrine.  The segmental airway could not be reestablished.  After sufficient biopsies had been obtained, the bronchoscope was removed.  The patient was extubated in  the operating room and taken to the [facility] in good condition.  LN/NUANCE  D:12/27/2018 T:12/27/2018 JOB:[id number]

## 2018-12-28 ENCOUNTER — Encounter (HOSPITAL_COMMUNITY): Payer: Self-pay | Admitting: *Deleted

## 2018-12-28 ENCOUNTER — Inpatient Hospital Stay (HOSPITAL_COMMUNITY): Payer: BC Managed Care – PPO | Attending: Hematology | Admitting: Hematology

## 2018-12-28 ENCOUNTER — Other Ambulatory Visit: Payer: Self-pay

## 2018-12-28 ENCOUNTER — Encounter (HOSPITAL_COMMUNITY): Payer: Self-pay | Admitting: Hematology

## 2018-12-28 ENCOUNTER — Telehealth (HOSPITAL_COMMUNITY): Payer: Self-pay

## 2018-12-28 VITALS — BP 151/78 | HR 97 | Temp 99.5°F | Resp 16 | Wt 132.5 lb

## 2018-12-28 DIAGNOSIS — C3412 Malignant neoplasm of upper lobe, left bronchus or lung: Secondary | ICD-10-CM | POA: Insufficient documentation

## 2018-12-28 DIAGNOSIS — Z87891 Personal history of nicotine dependence: Secondary | ICD-10-CM | POA: Insufficient documentation

## 2018-12-28 DIAGNOSIS — M25551 Pain in right hip: Secondary | ICD-10-CM

## 2018-12-28 DIAGNOSIS — C3490 Malignant neoplasm of unspecified part of unspecified bronchus or lung: Secondary | ICD-10-CM

## 2018-12-28 MED ORDER — TEMAZEPAM 15 MG PO CAPS: 15.0000 mg | ORAL_CAPSULE | Freq: Every evening | ORAL | 0 refills | Status: DC | PRN

## 2018-12-28 MED ORDER — TEMAZEPAM 15 MG PO CAPS: 15.0000 mg | ORAL_CAPSULE | Freq: Every evening | ORAL | 0 refills | Status: AC | PRN

## 2018-12-28 NOTE — Patient Instructions (Addendum)
[place] at [facility] Discharge Instructions  You were seen today by Dr. Delton Coombes. He went over your recent biopsy results. He will get you scheduled for a biopsy of your hip. He will see you back after your biopsy for labs and follow up.   Thank you for choosing [place] at [facility] to provide your oncology and hematology care.  To afford each patient quality time with our provider, please arrive at least 15 minutes before your scheduled appointment time.   If you have a lab appointment with the [facility] please come in thru the  Main Entrance and check in at the main information desk  You need to re-schedule your appointment should you arrive 10 or more minutes late.  We strive to give you quality time with our providers, and arriving late affects you and other patients whose appointments are after yours.  Also, if you no show three or more times for appointments you may be dismissed from the clinic at the providers discretion.     Again, thank you for choosing [facility].  Our hope is that these requests will decrease the amount of time that you wait before being seen by our physicians.       _____________________________________________________________  Should you have questions after your visit to [facility], please contact our office at [phone] between the hours of 8:00 a.m. and 4:30 p.m.  Voicemails left after 4:00 p.m. will not be returned until the following business day.  For prescription refill requests, have your pharmacy contact our office and allow 72 hours.    Cancer Center Support Programs:   > Cancer Support Group  2nd Tuesday of the month 1pm-2pm, Journey Room

## 2018-12-28 NOTE — Assessment & Plan Note (Addendum)
1.  Metastatic non-small cell adenocarcinoma of lung: - CT chest on 11/24/2018 shows large medial left upper lobe lung mass with left hilar, subcarinal and AP window bulky nodal soft tissue disease with left hilar adenopathy encasing the left main pulmonary artery. - PET CT scan on 12/09/2018 shows hypermetabolic left suprahilar/AP window mass with intense hypermetabolic activity most suggestive of small cell lung cancer.  Hypermetabolic focus in the periphery of the collapsed left upper lobe likely represents primary carcinoma.  Solitary peritoneal metastasis along with descending colon.  The skeletal metastasis with solitary large expansile lesion in the right iliac bone. - MRI of the lumbar spine on 12/01/2018 shows large metastatic deposit in the posterior right ilium with extension into the soft tissue.  Brain MRI on 12/01/2018 did not show any metastasis. -Beta-hCG was 1289.  She had a hysterectomy for struma ovarii many years ago. - She had bronchoscopy and biopsy on 12/20/2017.  Station 7 and 4L lymph nodes show atypical cells.  Left upper lobe brushings did not show any malignant cells. -I have talked to the pathologist.  This was consistent with non-small cell carcinoma.  Due to the elevated beta-hCG and the possibility of poorly differentiated embryonal cell carcinoma, a second opinion is requested from [facility]. -I have recommended another biopsy of the right hip, as we do not have enough tissue for other testing. - Patient is slightly depressed about her diagnosis.  She does not have any strong suicidal ideation.  I will start her on mirtazapine 15 mg which will also help with her appetite.  We will titrated up as needed.  We have also made social services consult. -I had a prolonged discussion with the patient about her diagnosis.  She will be arranged for the biopsy on 12/31/2018.  We will hold her Eliquis after today. -I will see her back in 1 week for follow-up.  2.  Right hip  pain: -She will continue Dilaudid 2 mg every 4 hours as needed.  This is well controlled at this time. -She will be referred to [facility] for palliative radiation to the right hip area.  3.  Bilateral kidney infarcts: - She was admitted to hospital on 12/16/2018, CT scan showed occlusive thrombosis in the distal left renal artery with left kidney infarction and right kidney infarct. -She was started on IV heparin with resolution of abdominal pain.  She was subsequently transitioned to Eliquis.

## 2018-12-28 NOTE — Progress Notes (Signed)
I spoke with [facility] and they are going to get patient in for an evaluation with their licensed behavior health therapists.  They will contact patient with an appointment.

## 2018-12-28 NOTE — Progress Notes (Signed)
[place], [place] [address]   CLINIC:  Medical Oncology/Hematology  PCP:  [facility] [place] [address] [phone]   REASON FOR VISIT:  Follow-up for metastatic lung cancer    INTERVAL HISTORY:  Yolanda Miller 63 y.o. female returns for routine follow-up. She is here today alone. She states that she does think at times that she doesn't want to live through this. She denies any plan to harm herself at this time. She states that she has not had a good appetite and would like to have something to help with that. Denies any nausea, vomiting, or diarrhea. Denies any new pains. Had not noticed any recent bleeding such as epistaxis, hematuria or hematochezia. Denies recent chest pain on exertion, shortness of breath on minimal exertion, pre-syncopal episodes, or palpitations. Denies any numbness or tingling in hands or feet. Denies any recent fevers, infections, or recent hospitalizations. Patient reports appetite at 25% and energy level at 25%.    REVIEW OF SYSTEMS:  Review of Systems  Psychiatric/Behavioral: Positive for depression and suicidal ideas.     PAST MEDICAL/SURGICAL HISTORY:  Past Medical History:  Diagnosis Date  . Anxiety   . Complication of anesthesia   . Depression   . Dyspnea   . History of hiatal hernia    per patient told years ago   . Migraines   . Pneumonia   . Thyroid disease    Past Surgical History:  Procedure Laterality Date  . ABDOMINAL HYSTERECTOMY    . CESAREAN SECTION    . LASIK Bilateral   . LEG SURGERY Left 2004  . THYROID CYST EXCISION    . THYROIDECTOMY     parathyroid as well  . TONSILLECTOMY    . VIDEO BRONCHOSCOPY WITH ENDOBRONCHIAL ULTRASOUND N/A 12/21/2018   Procedure: VIDEO BRONCHOSCOPY WITH ENDOBRONCHIAL ULTRASOUND;  Surgeon: Melrose Nakayama, MD;  Location: [facility];  Service: Thoracic;  Laterality: N/A;     SOCIAL HISTORY:  Social History    Socioeconomic History  . Marital status: Divorced    Spouse name: Not on file  . Number of children: Not on file  . Years of education: Not on file  . Highest education level: Not on file  Occupational History  . Not on file  Social Needs  . Financial resource strain: Not on file  . Food insecurity:    Worry: Not on file    Inability: Not on file  . Transportation needs:    Medical: Not on file    Non-medical: Not on file  Tobacco Use  . Smoking status: Former Smoker    Packs/day: 1.00    Years: 45.00    Pack years: 45.00    Last attempt to quit: 10/15/2018    Years since quitting: 0.2  . Smokeless tobacco: Never Used  Substance and Sexual Activity  . Alcohol use: Not Currently    Comment: occasionally  . Drug use: Not on file  . Sexual activity: Not on file  Lifestyle  . Physical activity:    Days per week: Not on file    Minutes per session: Not on file  . Stress: Not on file  Relationships  . Social connections:    Talks on phone: Not on file    Gets together: Not on file    Attends religious service: Not on file    Active member of club or organization: Not on file  Attends meetings of clubs or organizations: Not on file    Relationship status: Not on file  . Intimate partner violence:    Fear of current or ex partner: Not on file    Emotionally abused: Not on file    Physically abused: Not on file    Forced sexual activity: Not on file  Other Topics Concern  . Not on file  Social History Narrative  . Not on file    FAMILY HISTORY:  Family History  Problem Relation Age of Onset  . Cancer Mother   . Cancer Father   . Cancer Brother     CURRENT MEDICATIONS:  Outpatient Encounter Medications as of 12/28/2018  Medication Sig  . albuterol (PROVENTIL HFA;VENTOLIN HFA) 108 (90 Base) MCG/ACT inhaler Inhale 1-2 puffs into the lungs every 6 (six) hours as needed for wheezing or shortness of breath.  . ALPRAZolam (XANAX) 0.5 MG tablet Take 1 tablet (0.5 mg  total) by mouth 3 (three) times daily as needed for up to 10 doses for anxiety.  . cholecalciferol (VITAMIN D) 1000 units tablet Take 1,000 Units by mouth daily.  Marland Kitchen doxylamine, Sleep, (UNISOM) 25 MG tablet Take 25 mg by mouth at bedtime as needed for sleep.  . Eliquis DVT/PE Starter Pack (ELIQUIS STARTER PACK) 5 MG TABS Take as directed on package: start with two-5mg  tablets twice daily for 7 days. On day 8, switch to one-5mg  tablet twice daily.  Marland Kitchen HYDROcodone-acetaminophen (NORCO/VICODIN) 5-325 MG tablet Take 1 tablet by mouth every 6 (six) hours as needed for moderate pain.  Marland Kitchen HYDROmorphone (DILAUDID) 2 MG tablet Take 1 tablet (2 mg total) by mouth every 4 (four) hours as needed for severe pain.  . hydrOXYzine (ATARAX/VISTARIL) 25 MG tablet Take 25 mg by mouth every 8 (eight) hours as needed for anxiety.  Marland Kitchen ibuprofen (ADVIL) 200 MG tablet Take 400 mg by mouth every 6 (six) hours as needed for headache or moderate pain.  Marland Kitchen levothyroxine (SYNTHROID, LEVOTHROID) 100 MCG tablet Take 100 mcg by mouth daily before breakfast.   . polyethylene glycol (MIRALAX / GLYCOLAX) 17 g packet Take 17 g by mouth daily as needed for mild constipation.  . temazepam (RESTORIL) 15 MG capsule Take 1 capsule (15 mg total) by mouth at bedtime as needed for sleep.  . [DISCONTINUED] temazepam (RESTORIL) 15 MG capsule Take 1 capsule (15 mg total) by mouth at bedtime as needed for sleep.   No facility-administered encounter medications on file as of 12/28/2018.     ALLERGIES:  Allergies  Allergen Reactions  . Demerol [Meperidine] Itching  . Oxycodone-Acetaminophen Itching and Rash  . Propoxyphene N-Acetaminophen Itching and Rash     PHYSICAL EXAM:  ECOG Performance status: 2  Vitals:   12/28/18 1100  BP: (!) 151/78  Pulse: 97  Resp: 16  Temp: 99.5 F (37.5 C)  SpO2: 98%   Filed Weights   12/28/18 1100  Weight: 132 lb 8 oz (60.1 kg)    Physical Exam Vitals signs reviewed.  Constitutional:       Appearance: Normal appearance.  Cardiovascular:     Rate and Rhythm: Normal rate and regular rhythm.     Heart sounds: Normal heart sounds.  Pulmonary:     Effort: Pulmonary effort is normal.     Breath sounds: Normal breath sounds.  Abdominal:     General: There is no distension.     Palpations: Abdomen is soft. There is no mass.  Musculoskeletal:  General: No swelling.  Skin:    General: Skin is warm.  Neurological:     General: No focal deficit present.     Mental Status: She is alert and oriented to person, place, and time.  Psychiatric:        Mood and Affect: Mood normal.        Behavior: Behavior normal.      LABORATORY DATA:  I have reviewed the labs as listed.  CBC    Component Value Date/Time   WBC 12.7 (H) 12/24/2018 0607   RBC 3.34 (L) 12/24/2018 0607   HGB 9.7 (L) 12/24/2018 0607   HCT 29.9 (L) 12/24/2018 0607   PLT 330 12/24/2018 0607   MCV 89.5 12/24/2018 0607   MCH 29.0 12/24/2018 0607   MCHC 32.4 12/24/2018 0607   RDW 13.1 12/24/2018 0607   LYMPHSABS 4.6 (H) 10/15/2018 1821   MONOABS 1.0 10/15/2018 1821   EOSABS 0.1 10/15/2018 1821   BASOSABS 0.0 10/15/2018 1821   CMP Latest Ref Rng & Units 12/24/2018 12/23/2018 12/22/2018  Glucose 70 - 99 mg/dL 106(H) 104(H) 131(H)  BUN 8 - 23 mg/dL 9 12 8   Creatinine 0.44 - 1.00 mg/dL 0.84 0.99 1.16(H)  Sodium 135 - 145 mmol/L 141 142 143  Potassium 3.5 - 5.1 mmol/L 3.7 3.2(L) 3.4(L)  Chloride 98 - 111 mmol/L 103 104 106  CO2 22 - 32 mmol/L 26 26 25   Calcium 8.9 - 10.3 mg/dL 8.1(L) 7.9(L) 8.1(L)  Total Protein 6.5 - 8.1 g/dL - - -  Total Bilirubin 0.3 - 1.2 mg/dL - - -  Alkaline Phos 38 - 126 U/L - - -  AST 15 - 41 U/L - - -  ALT 0 - 44 U/L - - -       DIAGNOSTIC IMAGING:  I have independently reviewed the scans and discussed with the patient.   I have reviewed Venita Lick LPN's note and agree with the documentation.  I personally performed a face-to-face visit, made revisions and my assessment  and plan is as follows.    ASSESSMENT & PLAN:   Non-small cell carcinoma of lung ([place]) 1.  Metastatic non-small cell adenocarcinoma of lung: - CT chest on 11/24/2018 shows large medial left upper lobe lung mass with left hilar, subcarinal and AP window bulky nodal soft tissue disease with left hilar adenopathy encasing the left main pulmonary artery. - PET CT scan on 12/09/2018 shows hypermetabolic left suprahilar/AP window mass with intense hypermetabolic activity most suggestive of small cell lung cancer.  Hypermetabolic focus in the periphery of the collapsed left upper lobe likely represents primary carcinoma.  Solitary peritoneal metastasis along with descending colon.  The skeletal metastasis with solitary large expansile lesion in the right iliac bone. - MRI of the lumbar spine on 12/01/2018 shows large metastatic deposit in the posterior right ilium with extension into the soft tissue.  Brain MRI on 12/01/2018 did not show any metastasis. -Beta-hCG was 1289.  She had a hysterectomy for struma ovarii many years ago. - She had bronchoscopy and biopsy on 12/20/2017.  Station 7 and 4L lymph nodes show atypical cells.  Left upper lobe brushings did not show any malignant cells. -I have talked to the pathologist.  This was consistent with non-small cell carcinoma.  Due to the elevated beta-hCG and the possibility of poorly differentiated embryonal cell carcinoma, a second opinion is requested from [facility]. -I have recommended another biopsy of the right hip, as we do not have enough tissue for  other testing. - Patient is slightly depressed about her diagnosis.  She does not have any strong suicidal ideation.  I will start her on mirtazapine 15 mg which will also help with her appetite.  We will titrated up as needed.  We have also made social services consult. -I had a prolonged discussion with the patient about her diagnosis.  She will be arranged for the biopsy on 12/31/2018.  We will  hold her Eliquis after today. -I will see her back in 1 week for follow-up.  2.  Right hip pain: -She will continue Dilaudid 2 mg every 4 hours as needed.  This is well controlled at this time. -She will be referred to [facility] for palliative radiation to the right hip area.  3.  Bilateral kidney infarcts: - She was admitted to hospital on 12/16/2018, CT scan showed occlusive thrombosis in the distal left renal artery with left kidney infarction and right kidney infarct. -She was started on IV heparin with resolution of abdominal pain.  She was subsequently transitioned to Eliquis.   Total time spent is 40 minutes with more than 50% of time spent face-to-face discussing biopsy results, further plan, and coordination of care.    Orders placed this encounter:  No orders of the defined types were placed in this encounter.     Derek Jack, MD [place] [phone]

## 2018-12-28 NOTE — Progress Notes (Signed)
Okay per Dr. Delton Coombes to hold eliquis 48 hours prior to procedure. Patient is aware. I have notified central scheduling of the approval to hold anticoagulant.

## 2018-12-28 NOTE — Progress Notes (Signed)
Oncology Navigator Note: I was able to go in with Dr. Delton Coombes during the visit and I had a pretty good talk with her afterwards.  She is in a lot of pain from the cancer and then had this recent admission with renal infarcts.  She said that she just doesn't want to live like this.  She does not have a plan and said that she doesn't want to end her life she is just miserable.  She is depressed and I can only imagine her anxiety level.  She had a good conversation with me and was actually very positive about her inpatient experience and talked very highly of people.  I will call [facility] and have them get her an appointment ASAP to see one of their clinicians.    Patient has concerns about her weight dropping and doesn't know what to do. I have referred her to our nutritionist for evaluation and management.   I have notified our social worker and she will follow up by phone with the patient.

## 2018-12-28 NOTE — Progress Notes (Signed)
Patient states she has feelings of hopelessness and depression. States she does not think of harming others but has had thoughts of harming herself. No plan in place at this time. Does not seem suicidal at this time. physician notified. Message sent to social work.

## 2018-12-29 ENCOUNTER — Other Ambulatory Visit: Payer: Self-pay | Admitting: Radiology

## 2018-12-29 ENCOUNTER — Encounter: Payer: Self-pay | Admitting: General Practice

## 2018-12-29 NOTE — Progress Notes (Signed)
[facility] Notes  [facility] appt w behavioral health clinician scheduled for 6/11 at 10 AM.  Practice will notify patient.  Edwyna Shell, LCSW Clinical Social Worker Phone:  [phone]

## 2018-12-29 NOTE — Progress Notes (Signed)
[facility] CSW Progress Notes  Request received to call patient to touch base re depression screen yesterday at clinic.  Spoke w patient - process of diagnosis and recent hospitalization has been isolating and difficult.  Little outside support - is between churches, daughter comes intermittently.  Patient lives alone and is not currently working.  Work was a source of stability and support for her.  Concerned about finances - she has disability income which will replace 50% of her income.  Multiple medical expenses - concerned that upcoming radiation treatments will each require an $80 copay.  Does not understand how her insurance works or what it will pay.  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  Overall is uncomfortable, cannot eat well, anxious, depressed about diagnosis, limited support resources and little activity of interest during the day to distract her.  Agreeable to referral to [facility] for appt w behavioral health clinician, CSW will contact clinic and ensure this has been arranged.  Will also provide information on various resources for additional financial support options and cancer support resources.  [facility], awaiting time/date of appt.  Edwyna Shell, LCSW Clinical Social Worker Phone:  [phone]

## 2018-12-30 ENCOUNTER — Other Ambulatory Visit: Payer: Self-pay | Admitting: Radiology

## 2018-12-30 ENCOUNTER — Other Ambulatory Visit: Payer: Self-pay | Admitting: *Deleted

## 2018-12-30 ENCOUNTER — Other Ambulatory Visit (HOSPITAL_COMMUNITY): Payer: Self-pay | Admitting: *Deleted

## 2018-12-30 DIAGNOSIS — C3412 Malignant neoplasm of upper lobe, left bronchus or lung: Secondary | ICD-10-CM

## 2018-12-30 NOTE — Progress Notes (Signed)
The proposed treatment discussed in cancer conference 12/30/2018 is for discussion purpose only and is not a binding recommendation.  The patient was not physically examined nor present for their treatment options.  Therefore, final treatment plans cannot be decided.

## 2018-12-30 NOTE — Progress Notes (Signed)
Ambulatory referral to speech pathology because patient c/o food getting stuck. She is going to be evaluated by nutritionist at her next visit as well.

## 2018-12-31 ENCOUNTER — Ambulatory Visit (HOSPITAL_COMMUNITY)
Admission: RE | Admit: 2018-12-31 | Discharge: 2018-12-31 | Disposition: A | Discharge status: Home / Self Care | Payer: BC Managed Care – PPO | Source: Ambulatory Visit | Attending: Hematology | Admitting: Hematology

## 2018-12-31 ENCOUNTER — Encounter (HOSPITAL_COMMUNITY): Payer: Self-pay

## 2018-12-31 ENCOUNTER — Other Ambulatory Visit: Payer: Self-pay

## 2018-12-31 ENCOUNTER — Encounter: Payer: Self-pay | Admitting: General Practice

## 2018-12-31 DIAGNOSIS — Z79899 Other long term (current) drug therapy: Secondary | ICD-10-CM | POA: Insufficient documentation

## 2018-12-31 DIAGNOSIS — E079 Disorder of thyroid, unspecified: Secondary | ICD-10-CM | POA: Diagnosis not present

## 2018-12-31 DIAGNOSIS — R0600 Dyspnea, unspecified: Secondary | ICD-10-CM | POA: Insufficient documentation

## 2018-12-31 DIAGNOSIS — Z85118 Personal history of other malignant neoplasm of bronchus and lung: Secondary | ICD-10-CM | POA: Diagnosis not present

## 2018-12-31 DIAGNOSIS — Z87891 Personal history of nicotine dependence: Secondary | ICD-10-CM | POA: Insufficient documentation

## 2018-12-31 DIAGNOSIS — F329 Major depressive disorder, single episode, unspecified: Secondary | ICD-10-CM | POA: Insufficient documentation

## 2018-12-31 DIAGNOSIS — Z7901 Long term (current) use of anticoagulants: Secondary | ICD-10-CM | POA: Insufficient documentation

## 2018-12-31 DIAGNOSIS — F419 Anxiety disorder, unspecified: Secondary | ICD-10-CM | POA: Insufficient documentation

## 2018-12-31 DIAGNOSIS — C3412 Malignant neoplasm of upper lobe, left bronchus or lung: Secondary | ICD-10-CM | POA: Insufficient documentation

## 2018-12-31 LAB — CBC WITH DIFFERENTIAL/PLATELET
Abs Immature Granulocytes: 0.07 10*3/uL (ref 0.00–0.07)
Basophils Absolute: 0 10*3/uL (ref 0.0–0.1)
Basophils Relative: 0 %
Eosinophils Absolute: 0.5 10*3/uL (ref 0.0–0.5)
Eosinophils Relative: 3 %
HCT: 35.9 % — ABNORMAL LOW (ref 36.0–46.0)
Hemoglobin: 11.2 g/dL — ABNORMAL LOW (ref 12.0–15.0)
Immature Granulocytes: 1 %
Lymphocytes Relative: 20 %
Lymphs Abs: 2.9 10*3/uL (ref 0.7–4.0)
MCH: 28.8 pg (ref 26.0–34.0)
MCHC: 31.2 g/dL (ref 30.0–36.0)
MCV: 92.3 fL (ref 80.0–100.0)
Monocytes Absolute: 1 10*3/uL (ref 0.1–1.0)
Monocytes Relative: 7 %
Neutro Abs: 10.1 10*3/uL — ABNORMAL HIGH (ref 1.7–7.7)
Neutrophils Relative %: 69 %
Platelets: 552 10*3/uL — ABNORMAL HIGH (ref 150–400)
RBC: 3.89 MIL/uL (ref 3.87–5.11)
RDW: 13.5 % (ref 11.5–15.5)
WBC: 14.6 10*3/uL — ABNORMAL HIGH (ref 4.0–10.5)
nRBC: 0 % (ref 0.0–0.2)

## 2018-12-31 LAB — PROTIME-INR
INR: 1.1 (ref 0.8–1.2)
Prothrombin Time: 14.1 seconds (ref 11.4–15.2)

## 2018-12-31 MED ORDER — FENTANYL CITRATE (PF) 100 MCG/2ML IJ SOLN: 25.0000 ug | INTRAMUSCULAR | Status: DC | PRN

## 2018-12-31 MED ORDER — FENTANYL CITRATE (PF) 100 MCG/2ML IJ SOLN
INTRAMUSCULAR | Status: AC | PRN
  Administered 2018-12-31: 25 ug via INTRAVENOUS
  Administered 2018-12-31: 50 ug via INTRAVENOUS
  Administered 2018-12-31: 25 ug via INTRAVENOUS

## 2018-12-31 MED ORDER — MIDAZOLAM HCL 2 MG/2ML IJ SOLN
INTRAMUSCULAR | Status: AC
  Filled 2018-12-31: qty 4

## 2018-12-31 MED ORDER — MIDAZOLAM HCL 2 MG/2ML IJ SOLN
INTRAMUSCULAR | Status: AC | PRN
  Administered 2018-12-31 (×2): 0.5 mg via INTRAVENOUS

## 2018-12-31 MED ORDER — FLUMAZENIL 0.5 MG/5ML IV SOLN
INTRAVENOUS | Status: AC
  Filled 2018-12-31: qty 5

## 2018-12-31 MED ORDER — SODIUM CHLORIDE 0.9 % IV SOLN
INTRAVENOUS | Status: DC
  Administered 2018-12-31: 10:00:00 via INTRAVENOUS

## 2018-12-31 MED ORDER — FENTANYL CITRATE (PF) 100 MCG/2ML IJ SOLN
INTRAMUSCULAR | Status: AC
  Filled 2018-12-31: qty 4

## 2018-12-31 MED ORDER — NALOXONE HCL 0.4 MG/ML IJ SOLN
INTRAMUSCULAR | Status: AC
  Filled 2018-12-31: qty 1

## 2018-12-31 NOTE — Procedures (Signed)
Pre procedural Dx: Metastatic lung cancer  Post procedural Dx: Same  Technically successful CT guided biopsy of infiltrative mass involving the right hemipelvis.    EBL: None.   Complications: None immediate.   Ronny Bacon, MD Pager #: [phone]

## 2018-12-31 NOTE — Consult Note (Signed)
Chief Complaint: Patient was seen in consultation today for CT-guided biopsy of right ilium lesion  Referring Physician(s): [place]  Supervising Physician: Sandi Mariscal  Patient Status: [facility] - Out-[facility]  History of Present Illness: Yolanda Miller is a 63 y.o. female , ex- smoker, with history of metastatic non-small cell carcinoma of the left lung/likely adenocarcinoma with elevated beta hCG as well as hysterectomy for struma ovarii years ago. She also has a history of bilateral renal infarcts, on Eliquis, with LD on 6/3.  Recent PET scan has revealed hypermetabolic left suprahilar/AP window mass, hypermetabolic focus in the periphery of collapsed left upper lobe, evidence of contralateral lung mets in the right middle lobe, solitary peritoneal mets along descending colon and skeletal mets with solitary large expansile lesion in the right iliac bone.  Due to elevated beta hCG and possibility of poorly differentiated embryonal cell carcinoma outside pathological review is also being done. Request now made for CT-guided right ilium lesion biopsy for additional testing.  Past Medical History:  Diagnosis Date   Anxiety    Complication of anesthesia    Depression    Dyspnea    History of hiatal hernia    per patient told years ago    Migraines    Pneumonia    Thyroid disease     Past Surgical History:  Procedure Laterality Date   ABDOMINAL HYSTERECTOMY     CESAREAN SECTION     LASIK Bilateral    LEG SURGERY Left 2004   THYROID CYST EXCISION     THYROIDECTOMY     parathyroid as well   TONSILLECTOMY     VIDEO BRONCHOSCOPY WITH ENDOBRONCHIAL ULTRASOUND N/A 12/21/2018   Procedure: VIDEO BRONCHOSCOPY WITH ENDOBRONCHIAL ULTRASOUND;  Surgeon: Melrose Nakayama, MD;  Location: [facility] OR;  Service: Thoracic;  Laterality: N/A;    Allergies: Demerol [meperidine]; Oxycodone-acetaminophen; and Propoxyphene n-acetaminophen  Medications: Prior to Admission  medications   Medication Sig Start Date End Date Taking? Authorizing Provider  albuterol (PROVENTIL HFA;VENTOLIN HFA) 108 (90 Base) MCG/ACT inhaler Inhale 1-2 puffs into the lungs every 6 (six) hours as needed for wheezing or shortness of breath.    [provider]  ALPRAZolam Duanne Moron) 0.5 MG tablet Take 1 tablet (0.5 mg total) by mouth 3 (three) times daily as needed for up to 10 doses for anxiety. 12/24/18   Amin, Jeanella Flattery, MD  cholecalciferol (VITAMIN D) 1000 units tablet Take 1,000 Units by mouth daily.    [provider]  doxylamine, Sleep, (UNISOM) 25 MG tablet Take 25 mg by mouth at bedtime as needed for sleep.    [provider]  Eliquis DVT/PE Starter Pack (ELIQUIS STARTER PACK) 5 MG TABS Take as directed on package: start with two-5mg  tablets twice daily for 7 days. On day 8, switch to one-5mg  tablet twice daily. 12/24/18   Amin, Jeanella Flattery, MD  HYDROcodone-acetaminophen (NORCO/VICODIN) 5-325 MG tablet Take 1 tablet by mouth every 6 (six) hours as needed for moderate pain. 12/24/18   Derek Jack, MD  HYDROmorphone (DILAUDID) 2 MG tablet Take 1 tablet (2 mg total) by mouth every 4 (four) hours as needed for severe pain. 12/27/18   Derek Jack, MD  hydrOXYzine (ATARAX/VISTARIL) 25 MG tablet Take 25 mg by mouth every 8 (eight) hours as needed for anxiety. 11/26/18   [provider]  ibuprofen (ADVIL) 200 MG tablet Take 400 mg by mouth every 6 (six) hours as needed for headache or moderate pain.    [provider]  levothyroxine (SYNTHROID, LEVOTHROID) 100 MCG tablet Take 100 mcg by mouth daily before breakfast.     [provider]  polyethylene glycol (MIRALAX / GLYCOLAX) 17 g packet Take 17 g by mouth daily as needed for mild constipation. 12/24/18   Amin, Ankit Chirag, MD  temazepam (RESTORIL) 15 MG capsule Take 1 capsule (15 mg total) by mouth at bedtime as needed for sleep. 12/28/18   Derek Jack, MD     Family  History  Problem Relation Age of Onset   Cancer Mother    Cancer Father    Cancer Brother     Social History   Socioeconomic History   Marital status: Divorced    Spouse name: Not on file   Number of children: Not on file   Years of education: Not on file   Highest education level: Not on file  Occupational History   Not on file  Social Needs   Financial resource strain: Not on file   Food insecurity:    Worry: Not on file    Inability: Not on file   Transportation needs:    Medical: Not on file    Non-medical: Not on file  Tobacco Use   Smoking status: Former Smoker    Packs/day: 1.00    Years: 45.00    Pack years: 45.00    Last attempt to quit: 10/15/2018    Years since quitting: 0.2   Smokeless tobacco: Never Used  Substance and Sexual Activity   Alcohol use: Not Currently    Comment: occasionally   Drug use: Not on file   Sexual activity: Not on file  Lifestyle   Physical activity:    Days per week: Not on file    Minutes per session: Not on file   Stress: Not on file  Relationships   Social connections:    Talks on phone: Not on file    Gets together: Not on file    Attends religious service: Not on file    Active member of club or organization: Not on file    Attends meetings of clubs or organizations: Not on file    Relationship status: Not on file  Other Topics Concern   Not on file  Social History Narrative   Not on file      Review of Systems currently denies fever, headache, chest pain, abdominal pain, vomiting or abnormal bleeding.  She does have weight loss, weakness/fatigue, dyspnea, cough, and right buttocks pain with radiation down right leg  Vital Signs: Blood pressure 156/88, temp 99.4, heart rate 94, respirations 18, O2 sat 99% room air   Physical Exam awake, alert.  Chest with slightly diminished breath sounds left upper lobe/occ wheeze, right clear. Heart with regular rate and rhythm.  Abdomen soft, positive  bowel sounds, nontender.  Tender right posterior buttocks region.  Extremities with full range of motion.  Imaging: Dg Chest 2 View  Result Date: 12/21/2018 CLINICAL DATA:  Lung biopsy EXAM: CHEST - 2 VIEW COMPARISON:  12/15/2018 FINDINGS: Again noted is a left upper lobe lung mass. There has been interval development of a left-sided pleural effusion and probable left lower lobe atelectasis. No pneumothorax. No acute osseous abnormality. IMPRESSION: 1. Persistent large left upper lobe lung mass. 2. Interval development of a small left-sided pleural effusion with adjacent atelectasis. Electronically Signed   By: Constance Holster M.D.   On: 12/21/2018 00:41   Dg Chest 2 View  Result Date: 12/15/2018 CLINICAL DATA:  Video bronchoscopy. EXAM: CHEST -  2 VIEW COMPARISON:  CT chest dated 11/24/2018. Chest x-ray dated 11/04/2018. FINDINGS: Again noted is a left upper lobe mass. This mass is better appreciated on prior CT and PET-CT. The mass appears grossly stable in size from prior chest x-ray. There is no pneumothorax. No large pleural effusion. The right lung field is essentially clear. There is no acute osseous abnormality. IMPRESSION: Stable appearance of the chest with a persistent left upper lobe mass, better evaluated on prior PET-CT and diagnostic contrast-enhanced CT. Electronically Signed   By: Constance Holster M.D.   On: 12/15/2018 14:31   Mr Jeri Cos JM Contrast  Result Date: 12/01/2018 CLINICAL DATA:  Weakness, difficulty walking, low back pain. New diagnosis of lung cancer. EXAM: MRI HEAD WITHOUT AND WITH CONTRAST TECHNIQUE: Multiplanar, multiecho pulse sequences of the brain and surrounding structures were obtained without and with intravenous contrast. CONTRAST:  Gadavist 6 mL. COMPARISON:  None. FINDINGS: The patient was unable to remain motionless for the exam. Small or subtle lesions could be overlooked. Brain: No evidence for acute infarction, hemorrhage, mass lesion, hydrocephalus, or  extra-axial fluid. Normal for age cerebral volume. Minor T2 and FLAIR hyperintensities in the periventricular and subcortical white matter likely early small vessel disease. Post infusion, no abnormal enhancement of the brain or meninges. Vascular: Flow voids are maintained throughout the carotid, basilar, and vertebral arteries. There are no areas of chronic hemorrhage. Skull and upper cervical spine: Unremarkable visualized calvarium, skullbase, and cervical vertebrae. Pituitary, pineal, cerebellar tonsils unremarkable. No upper cervical cord lesions. Sinuses/Orbits: No orbital masses or proptosis. Globes appear symmetric. Sinuses appear well aerated, without evidence for air-fluid level. Other: Unremarkable visualized calvarium, skullbase, and cervical vertebrae. Pituitary, pineal, cerebellar tonsils unremarkable. No upper cervical cord lesions. IMPRESSION: Within limits for detection due to patient motion, no acute intracranial findings. No visible intracranial metastatic disease is observed. No visible osseous disease. Electronically Signed   By: Staci Righter M.D.   On: 12/01/2018 13:59   Mr Lumbar Spine W Wo Contrast  Result Date: 12/01/2018 CLINICAL DATA:  Patient recently diagnosed with lung cancer. Low back pain and difficulty walking. EXAM: MRI LUMBAR SPINE WITHOUT AND WITH CONTRAST TECHNIQUE: Multiplanar and multiecho pulse sequences of the lumbar spine were obtained without and with intravenous contrast. CONTRAST:  6 cc Gadavist IV. COMPARISON:  Plain films lumbar spine 11/05/2018. MRI lumbar spine 08/02/2004. FINDINGS: Segmentation:  Standard. Alignment:  Maintained. Vertebrae: Negative for metastatic disease. No fracture or focal lesion. Conus medullaris and cauda equina: Conus extends to the L1 level. Conus and cauda equina appear normal. Paraspinal and other soft tissues: The patient has a large metastatic deposit in the right ilium adjacent to the SI joint. There is associated cortical  destructive change and extension into the soft tissues. The lesion is incompletely imaged. Disc levels: T11-12 and T12-L1 are imaged in the sagittal plane only and negative. L1-2: Negative. L2-3: Shallow disc bulge without stenosis. L3-4: New shallow disc bulge more prominent to the left and ligamentum flavum thickening. There is mild central canal and left foraminal narrowing. L4-5: Broad-based disc bulge and ligamentum flavum thickening cause moderate central canal stenosis and some narrowing in the subarticular recesses. The right foramen is open. Mild left foraminal narrowing noted. Spondylosis has worsened since the prior MRI. L5-S1: Shallow disc bulge without stenosis. IMPRESSION: Large metastatic deposit in the posterior right ilium with extension into the soft tissues is incompletely imaged on this examination. If indicated, dedicated MRI of the pelvis with and without contrast could be  used for further evaluation. Negative for metastatic disease of the lumbar spine. Progressive spondylosis at L4-5 where there is moderate central canal and mild bilateral subarticular recess narrowing. Shallow disc bulge to the left at L3-4 causes mild central canal and left foraminal narrowing. The bulge is new since the prior MRI. Electronically Signed   By: Inge Rise M.D.   On: 12/01/2018 13:52   Ct Abdomen Pelvis W Contrast  Result Date: 12/16/2018 CLINICAL DATA:  Left lower quadrant pain with nausea and vomiting EXAM: CT ABDOMEN AND PELVIS WITH CONTRAST TECHNIQUE: Multidetector CT imaging of the abdomen and pelvis was performed using the standard protocol following bolus administration of intravenous contrast. CONTRAST:  151mL OMNIPAQUE IOHEXOL 300 MG/ML  SOLN COMPARISON:  PET CT 12/08/2018 FINDINGS: Lower chest: Lung bases demonstrate no acute consolidation or effusion. The heart size is normal. Partially visualized small pericardial effusion measuring up to 9 mm in thickness. Hepatobiliary: No focal liver  abnormality is seen. No gallstones, gallbladder wall thickening, or biliary dilatation. Pancreas: Unremarkable. No pancreatic ductal dilatation or surrounding inflammatory changes. Spleen: Normal in size without focal abnormality. Adrenals/Urinary Tract: Right adrenal gland is normal. Slightly nodular left adrenal gland without well-defined mass. No hydronephrosis. Wedge-shaped hypodensity within the mid right kidney and lower pole of the right kidney, suspect for infarcts. Diffuse hypoenhancement of the left kidney with minimal foci of cortical enhancement, consistent with infarcted left kidney. Occlusive thrombus within the distal left renal artery. Renal vein appears to enhance normally. The bladder is unremarkable Stomach/Bowel: Stomach is within normal limits. Appendix appears normal. No evidence of bowel wall thickening, distention, or inflammatory changes. Vascular/Lymphatic: Nonaneurysmal aorta. Moderate aortic atherosclerosis. Subcentimeter retroperitoneal lymph nodes Reproductive: Status post hysterectomy. No adnexal masses. Other: No free air. Thickening in the left gutter with 9 mm intraperitoneal nodule. Musculoskeletal: Large metastatic lesion involving the right iliac bone. Soft tissue component extends into the right gluteal musculature. IMPRESSION: 1. Diffuse hypoenhancement of the left kidney with small foci of cortical sparing, findings are consistent with diffuse infarction of the left kidney. There is occlusive thrombus present within the distal left renal artery. Right kidney demonstrates small foci of cortical wedge-shaped hypodensity, also suspect for renal infarcts. 2. Left lower quadrant 9 mm peritoneal nodule consistent with metastatic disease 3. Large metastatic lesion involving the right iliac bone. 4. Small pericardial effusion Electronically Signed   By: Donavan Foil M.D.   On: 12/16/2018 23:09   Nm Pet Image Initial (pi) Skull Base To Thigh  Result Date: 12/09/2018 CLINICAL DATA:   Initial treatment strategy for lung mass. EXAM: NUCLEAR MEDICINE PET SKULL BASE TO THIGH TECHNIQUE: 7.0 mCi F-18 FDG was injected intravenously. Full-ring PET imaging was performed from the skull base to thigh after the radiotracer. CT data was obtained and used for attenuation correction and anatomic localization. Fasting blood glucose: 104 mg/dl COMPARISON:  CT 11/24/2018 FINDINGS: Mediastinal blood pool activity: SUV max 2.2 Liver activity: SUV max NA NECK: No hypermetabolic lymph nodes in the neck. Incidental CT findings: none CHEST: Hypermetabolic RIGHT AP window mass surrounding the LEFT pulmonary artery is intensely hypermetabolic with SUV max equal 30.9. Mass measures approximately 6.5 by 5.5 cm. There is collapse of the LEFT upper lobe secondary to AP window mass surrounding the upper lobe bronchus. In the periphery of this collapsed LEFT upper lobe there is intense hypermetabolic focus along the anterior chest wall SUV max equal 27.4. This hypermetabolic region measures approximately 4 cm. No clear evidence of extension through the chest wall.  Mass abuts the pleural surface. No hypermetabolic supraclavicular nodes. Within the RIGHT middle lobe elongated nodule measuring 13 mm (image 77/4) has associated metabolic activity with SUV max equal 3.7. Incidental CT findings: none ABDOMEN/PELVIS: No abnormal hypermetabolic activity within the liver, pancreas, adrenal glands, or spleen. No hypermetabolic lymph nodes in the abdomen or pelvis. Hypermetabolic focus in the LEFT abdomen peritoneal space along the anti mesenteric border of the descending colon corresponds to peritoneal nodule measuring 7 mm (image 129/4) with SUV max equal 5.8. No additional evidence peritoneal metastasis. Incidental CT findings: SKELETON: Large expansile lytic lesion in the RIGHT iliac bone measures up to 6 cm with SUV max equal 15.5. There is soft tissue expansion of the bone lesion abutting the RIGHT gluteus muscle. Incidental CT  findings: none IMPRESSION: 1. Hypermetabolic LEFT suprahilar/AP window mass with intense metabolic activity is most suggestive of small cell lung cancer. 2. Hypermetabolic focus in the periphery of the collapsed LEFT upper lobe likely represents the primary bronchogenic carcinoma. 3. Evidence of contralateral lung metastasis in the RIGHT middle lobe. 4. Solitary peritoneal metastasis along the descending colon. 5. Skeletal metastasis with solitary large expansile lesion in the RIGHT iliac bone. 6. Stage IV carcinoma. Electronically Signed   By: Suzy Bouchard M.D.   On: 12/09/2018 09:34   Dg Chest Port 1 View  Result Date: 12/21/2018 CLINICAL DATA:  Dyspnea EXAM: PORTABLE CHEST 1 VIEW COMPARISON:  Earlier today at 10 a.m. FINDINGS: Stable left upper lobe mass. There is persistent volume loss in the left lung. No pneumothorax. Right lung is clear. Normal heart size. IMPRESSION: Stable left upper lobe mass.  No pneumothorax. Electronically Signed   By: Marybelle Killings M.D.   On: 12/21/2018 13:05   Dg Chest Port 1 View  Result Date: 12/21/2018 CLINICAL DATA:  Post bronchoscopy. EXAM: PORTABLE CHEST 1 VIEW COMPARISON:  Radiographs 12/21/2018.  CT 11/24/2018. FINDINGS: The heart size and mediastinal contours are stable. The aortic arch remains obscured by the left upper lobe mass and associated volume loss. Patchy left basilar atelectasis and a small left pleural effusion are unchanged. There is no pneumothorax. The right lung is clear. No acute osseous findings. IMPRESSION: No demonstrated complication or significant changes following bronchoscopy. Persistent left upper lobe mass and collapse. Electronically Signed   By: Richardean Sale M.D.   On: 12/21/2018 10:15    Labs:  CBC: Recent Labs    12/21/18 0336 12/22/18 0421 12/23/18 0032 12/24/18 0607  WBC 9.5 16.4* 17.3* 12.7*  HGB 9.0* 8.9* 9.7* 9.7*  HCT 27.9* 27.5* 29.6* 29.9*  PLT 229 273 341 330    COAGS: Recent Labs    12/15/18 0942  INR  1.1  APTT 32    BMP: Recent Labs    12/21/18 0336 12/22/18 0421 12/23/18 0032 12/24/18 0607  NA 140 143 142 141  K 3.6 3.4* 3.2* 3.7  CL 111 106 104 103  CO2 22 25 26 26   GLUCOSE 102* 131* 104* 106*  BUN 5* 8 12 9   CALCIUM 8.0* 8.1* 7.9* 8.1*  CREATININE 0.97 1.16* 0.99 0.84  GFRNONAA >60 51* >60 >60  GFRAA >60 59* >60 >60    LIVER FUNCTION TESTS: Recent Labs    10/15/18 1821 12/15/18 0942 12/16/18 1935 12/21/18 0336  BILITOT 0.4 0.5 0.6 0.4  AST 14* 15 27 14*  ALT 15 14 21  37  ALKPHOS 70 72 76 78  PROT 8.3* 7.6 7.5 5.5*  ALBUMIN 4.5 4.2 4.1 2.4*    TUMOR MARKERS:  No results for input(s): AFPTM, CEA, CA199, CHROMGRNA in the last 8760 hours.  Assessment and Plan:  63 y.o. female , ex- smoker, with history of metastatic non-small cell carcinoma of the left lung/likely adenocarcinoma with elevated beta hCG as well as hysterectomy for struma ovarii years ago. She also has a history of bilateral renal infarcts, on Eliquis, with LD on 6/3.  Recent PET scan has revealed hypermetabolic left suprahilar/AP window mass, hypermetabolic focus in the periphery of collapsed left upper lobe, evidence of contralateral lung mets in the right middle lobe, solitary peritoneal mets along descending colon and skeletal mets with solitary large expansile lesion in the right iliac bone.  Due to elevated beta hCG and possibility of poorly differentiated embryonal cell carcinoma outside pathological review is also being done. Request now made for CT-guided right ilium lesion/iliac bone biopsy for additional testing.Risks and benefits of procedure was discussed with the patient  including, but not limited to bleeding, infection, damage to adjacent structures or low yield requiring additional tests. COVID neg on 12/15/18.  All of the questions were answered and there is agreement to proceed.  Consent signed and in chart.     Thank you for this interesting consult.  I greatly enjoyed meeting  JULLIANNA GABOR and look forward to participating in their care.  A copy of this report was sent to the requesting provider on this date.  Electronically Signed: D. Rowe Robert, PA-C 12/31/2018, 10:02 AM   I spent a total of  25 minutes   in face to face in clinical consultation, greater than 50% of which was counseling/coordinating care for CT-guided right ilium lesion/iliac bone biopsy

## 2018-12-31 NOTE — Progress Notes (Signed)
Question Rowe Robert PA about when to restart eliquis after bone biopsy. Patient to restart 01/01/2019 AM

## 2018-12-31 NOTE — Discharge Instructions (Signed)
Needle Biopsy, Care After °These instructions tell you how to care for yourself after your procedure. Your doctor may also give you more specific instructions. Call your doctor if you have any problems or questions. °What can I expect after the procedure? °After the procedure, it is common to have: °· Soreness. °· Bruising. °· Mild pain. °Follow these instructions at home: ° °· Return to your normal activities as told by your doctor. Ask your doctor what activities are safe for you. °· Take over-the-counter and prescription medicines only as told by your doctor. °· Wash your hands with soap and water before you change your bandage (dressing). If you cannot use soap and water, use hand sanitizer. °· Follow instructions from your doctor about: °? How to take care of your puncture site. °? When and how to change your bandage. °? When to remove your bandage. °· Check your puncture site every day for signs of infection. Watch for: °? Redness, swelling, or pain. °? Fluid or blood.  °? Pus or a bad smell. °? Warmth. °· Do not take baths, swim, or use a hot tub until your doctor approves. Ask your doctor if you may take showers. You may only be allowed to take sponge baths. °· Keep all follow-up visits as told by your doctor. This is important. °Contact a doctor if you have: °· A fever. °· Redness, swelling, or pain at the puncture site, and it lasts longer than a few days. °· Fluid, blood, or pus coming from the puncture site. °· Warmth coming from the puncture site. °Get help right away if: °· You have a lot of bleeding from the puncture site. °Summary °· After the procedure, it is common to have soreness, bruising, or mild pain at the puncture site. °· Check your puncture site every day for signs of infection, such as redness, swelling, or pain. °· Get help right away if you have severe bleeding from your puncture site. °This information is not intended to replace advice given to you by your health care provider. Make  sure you discuss any questions you have with your health care provider. °Document Released: 06/26/2008 Document Revised: 07/27/2017 Document Reviewed: 07/27/2017 °Elsevier Interactive Patient Education © 2019 Elsevier Inc[employer] °Moderate Conscious Sedation, Adult, Care After °These instructions provide you with information about caring for yourself after your procedure. Your health care provider may also give you more specific instructions. Your treatment has been planned according to current medical practices, but problems sometimes occur. Call your health care provider if you have any problems or questions after your procedure. °What can I expect after the procedure? °After your procedure, it is common: °· To feel sleepy for several hours. °· To feel clumsy and have poor balance for several hours. °· To have poor judgment for several hours. °· To vomit if you eat too soon. °Follow these instructions at home: °For at least 24 hours after the procedure: ° °· Do not: °? Participate in activities where you could fall or become injured. °? Drive. °? Use heavy machinery. °? Drink alcohol. °? Take sleeping pills or medicines that cause drowsiness. °? Make important decisions or sign legal documents. °? Take care of children on your own. °· Rest. °Eating and drinking °· Follow the diet recommended by your health care provider. °· If you vomit: °? Drink water, juice, or soup when you can drink without vomiting. °? Make sure you have little or no nausea before eating solid foods. °General instructions °· Have a responsible adult stay   with you until you are awake and alert. °· Take over-the-counter and prescription medicines only as told by your health care provider. °· If you smoke, do not smoke without supervision. °· Keep all follow-up visits as told by your health care provider. This is important. °Contact a health care provider if: °· You keep feeling nauseous or you keep vomiting. °· You feel light-headed. °· You develop a  rash. °· You have a fever. °Get help right away if: °· You have trouble breathing. °This information is not intended to replace advice given to you by your health care provider. Make sure you discuss any questions you have with your health care provider. °Document Released: 05/04/2013 Document Revised: 12/17/2015 Document Reviewed: 11/03/2015 °Elsevier Interactive Patient Education © 2019 Elsevier Inc. ° °

## 2018-12-31 NOTE — Progress Notes (Signed)
[facility] CSW Progress Notes  Call to patient - stated she had not received emailed information from [place].  Confirmed email address of [email or web address].  Emailed again to patient.  Pt states that she has heard from [facility] - they are working on scheduling her w their behavioral health clinician for mental health support.  Edwyna Shell, LCSW Clinical Social Worker Phone:  [phone]

## 2019-01-01 ENCOUNTER — Encounter (HOSPITAL_COMMUNITY): Payer: Self-pay | Admitting: Emergency Medicine

## 2019-01-01 ENCOUNTER — Emergency Department (HOSPITAL_COMMUNITY)
Admission: EM | Admit: 2019-01-01 | Discharge: 2019-01-02 | Discharge status: Against Medical Advice | Payer: BC Managed Care – PPO | Attending: Emergency Medicine | Admitting: Emergency Medicine

## 2019-01-01 ENCOUNTER — Emergency Department (HOSPITAL_COMMUNITY): Payer: BC Managed Care – PPO

## 2019-01-01 ENCOUNTER — Other Ambulatory Visit: Payer: Self-pay

## 2019-01-01 DIAGNOSIS — Z79899 Other long term (current) drug therapy: Secondary | ICD-10-CM | POA: Diagnosis not present

## 2019-01-01 DIAGNOSIS — C7800 Secondary malignant neoplasm of unspecified lung: Secondary | ICD-10-CM | POA: Insufficient documentation

## 2019-01-01 DIAGNOSIS — R1310 Dysphagia, unspecified: Secondary | ICD-10-CM | POA: Diagnosis present

## 2019-01-01 DIAGNOSIS — Z87891 Personal history of nicotine dependence: Secondary | ICD-10-CM | POA: Diagnosis not present

## 2019-01-01 NOTE — ED Triage Notes (Signed)
Patient c/o difficulty drinking and eating x2 weeks. Patient denies any nausea, vomiting, or diarrhea. Per patient "feels like something is in throat when she trie to swallow." Denies getting choked on anything. Reports acid reflux when she does try to eat. Patient has left upper lung cancer but hasn't started treatment. Patient seen by PCP for difficulty eating and drinking and was referred to "nutritionalist"  in which she has an appointment Tuesday.

## 2019-01-02 ENCOUNTER — Emergency Department (HOSPITAL_COMMUNITY): Payer: BC Managed Care – PPO

## 2019-01-02 LAB — CBC WITH DIFFERENTIAL/PLATELET
Abs Immature Granulocytes: 0.05 10*3/uL (ref 0.00–0.07)
Basophils Absolute: 0 10*3/uL (ref 0.0–0.1)
Basophils Relative: 0 %
Eosinophils Absolute: 0.6 10*3/uL — ABNORMAL HIGH (ref 0.0–0.5)
Eosinophils Relative: 4 %
HCT: 34.8 % — ABNORMAL LOW (ref 36.0–46.0)
Hemoglobin: 10.8 g/dL — ABNORMAL LOW (ref 12.0–15.0)
Immature Granulocytes: 0 %
Lymphocytes Relative: 28 %
Lymphs Abs: 3.8 10*3/uL (ref 0.7–4.0)
MCH: 28.4 pg (ref 26.0–34.0)
MCHC: 31 g/dL (ref 30.0–36.0)
MCV: 91.6 fL (ref 80.0–100.0)
Monocytes Absolute: 1.1 10*3/uL — ABNORMAL HIGH (ref 0.1–1.0)
Monocytes Relative: 8 %
Neutro Abs: 8 10*3/uL — ABNORMAL HIGH (ref 1.7–7.7)
Neutrophils Relative %: 60 %
Platelets: 533 10*3/uL — ABNORMAL HIGH (ref 150–400)
RBC: 3.8 MIL/uL — ABNORMAL LOW (ref 3.87–5.11)
RDW: 13.4 % (ref 11.5–15.5)
WBC: 13.5 10*3/uL — ABNORMAL HIGH (ref 4.0–10.5)
nRBC: 0 % (ref 0.0–0.2)

## 2019-01-02 LAB — COMPREHENSIVE METABOLIC PANEL
ALT: 13 U/L (ref 0–44)
AST: 11 U/L — ABNORMAL LOW (ref 15–41)
Albumin: 3.9 g/dL (ref 3.5–5.0)
Alkaline Phosphatase: 95 U/L (ref 38–126)
Anion gap: 13 (ref 5–15)
BUN: 12 mg/dL (ref 8–23)
CO2: 26 mmol/L (ref 22–32)
Calcium: 9.5 mg/dL (ref 8.9–10.3)
Chloride: 98 mmol/L (ref 98–111)
Creatinine, Ser: 0.87 mg/dL (ref 0.44–1.00)
GFR calc Af Amer: >60 mL/min (ref >60)
GFR calc non Af Amer: >60 mL/min (ref >60)
Glucose, Bld: 119 mg/dL — ABNORMAL HIGH (ref 70–99)
Potassium: 5 mmol/L (ref 3.5–5.1)
Sodium: 137 mmol/L (ref 135–145)
Total Bilirubin: 0.3 mg/dL (ref 0.3–1.2)
Total Protein: 7.8 g/dL (ref 6.5–8.1)

## 2019-01-02 LAB — TROPONIN I: Troponin I: <0.03 ng/mL (ref <0.03)

## 2019-01-02 MED ORDER — HYDROMORPHONE HCL 1 MG/ML IJ SOLN
1.0000 mg | Freq: Once | INTRAMUSCULAR | Status: AC
  Administered 2019-01-02: 1 mg via INTRAVENOUS
  Filled 2019-01-02: qty 1

## 2019-01-02 MED ORDER — LIDOCAINE VISCOUS HCL 2 % MT SOLN
OROMUCOSAL | Status: AC
  Filled 2019-01-02: qty 15

## 2019-01-02 MED ORDER — ALUM & MAG HYDROXIDE-SIMETH 200-200-20 MG/5ML PO SUSP
30.0000 mL | Freq: Once | ORAL | Status: AC
  Administered 2019-01-02: 30 mL via ORAL

## 2019-01-02 MED ORDER — ALUM & MAG HYDROXIDE-SIMETH 200-200-20 MG/5ML PO SUSP
ORAL | Status: AC
  Filled 2019-01-02: qty 30

## 2019-01-02 MED ORDER — SODIUM CHLORIDE 0.9 % IV BOLUS
1000.0000 mL | Freq: Once | INTRAVENOUS | Status: AC
  Administered 2019-01-02: 1000 mL via INTRAVENOUS

## 2019-01-02 MED ORDER — LIDOCAINE VISCOUS HCL 2 % MT SOLN
15.0000 mL | Freq: Once | OROMUCOSAL | Status: AC
  Administered 2019-01-02: 15 mL via ORAL

## 2019-01-02 NOTE — ED Provider Notes (Signed)
[facility] EMERGENCY DEPARTMENT Provider Note   CSN: [id number] Arrival date & time: 01/01/19  2151    History   Chief Complaint Chief Complaint  Patient presents with  . Other    unable to eat or drink    HPI Yolanda Miller is a 63 y.o. 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  She describes difficulty swallowing both solid food and water reporting a foreign body sensation with attempts to swallow.  She reports being afraid to eat over concern for possible choking but this has not happened, although reports a few episodes of spitting up her food.  She has had no nausea, vomiting or diarrhea.  She has had a significant weight loss since she was diagnosed with her cancer.  She has discussed these symptoms with her PCP and was referred to a nutritionalist who she is scheduled to see in 3 days.  She is able to swallow her saliva, denies chest pain, fevers, chills, also no mouth or throat pain.    The history is provided by the patient.    Past Medical History:  Diagnosis Date  . Anxiety   . Complication of anesthesia   . Depression   . Dyspnea   . History of hiatal hernia    per patient told years ago   . Migraines   . Pneumonia   . Thyroid disease     Patient Active Problem List   Diagnosis Date Noted  . Non-small cell carcinoma of lung ([place]) 12/21/2018  . Renal infarction ([place]) 12/17/2018  . 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 Cancer-related pain 12/17/2018  . Mild renal insufficiency 12/17/2018  . Renal infarct ([place]) 12/17/2018  . Mass of upper lobe of left lung 11/26/2018  . Close Exposure to Covid-19 Virus 10/17/2018  . Pneumonia 10/15/2018  . Hypothyroidism 03/24/2007  . HYPERTENSION 03/24/2007    Past Surgical History:  Procedure Laterality Date  . ABDOMINAL HYSTERECTOMY     . CESAREAN SECTION    . DEEP PELVIS / HIP BIOPSY    . LASIK Bilateral   . LEG SURGERY Left 2004  . THYROID CYST EXCISION    . THYROIDECTOMY     parathyroid as well  . TONSILLECTOMY    . VIDEO BRONCHOSCOPY WITH ENDOBRONCHIAL ULTRASOUND N/A 12/21/2018   Procedure: VIDEO BRONCHOSCOPY WITH ENDOBRONCHIAL ULTRASOUND;  Surgeon: Melrose Nakayama, MD;  Location: [facility];  Service: Thoracic;  Laterality: N/A;     OB History   No obstetric history on file.      Home Medications    Prior to Admission medications   Medication Sig Start Date End Date Taking? Authorizing Provider  albuterol (PROVENTIL HFA;VENTOLIN HFA) 108 (90 Base) MCG/ACT inhaler Inhale 1-2 puffs into the lungs every 6 (six) hours as needed for wheezing or shortness of breath.   Yes [provider]  ALPRAZolam (XANAX) 0.5 MG tablet Take 1 tablet (0.5 mg total) by mouth 3 (three) times daily as needed for up to 10 doses for anxiety. 12/24/18  Yes Amin, Jeanella Flattery, MD  cholecalciferol (VITAMIN D) 1000 units tablet Take 1,000 Units by mouth daily.   Yes [provider]  Eliquis DVT/PE Starter Pack (ELIQUIS STARTER PACK) 5 MG TABS Take as directed on package: start with two-5mg  tablets twice daily for 7 days. On day 8, switch to one-5mg  tablet twice daily.  Patient taking differently: Take 5-10 mg by mouth See admin instructions. Take as directed on package: start with two-5mg  tablets twice daily for 7 days. On day 8, switch to one-5mg  tablet twice daily. 12/24/18  Yes Amin, Ankit Chirag, MD  HYDROmorphone (DILAUDID) 2 MG tablet Take 1 tablet (2 mg total) by mouth every 4 (four) hours as needed for severe pain. 12/27/18  Yes Derek Jack, MD  hydrOXYzine (ATARAX/VISTARIL) 25 MG tablet Take 25 mg by mouth every 8 (eight) hours as needed for anxiety. 11/26/18  Yes [provider]  levothyroxine (SYNTHROID, LEVOTHROID) 100 MCG tablet Take 100 mcg by mouth daily before breakfast.    Yes [provider]  polyethylene glycol (MIRALAX / GLYCOLAX) 17 g packet Take 17 g by mouth daily as needed for mild constipation. 12/24/18  Yes Amin, Ankit Chirag, MD  temazepam (RESTORIL) 15 MG capsule Take 1 capsule (15 mg total) by mouth at bedtime as needed for sleep. Patient taking differently: Take 15 mg by mouth at bedtime.  12/28/18  Yes Derek Jack, MD  HYDROcodone-acetaminophen (NORCO/VICODIN) 5-325 MG tablet Take 1 tablet by mouth every 6 (six) hours as needed for moderate pain. Patient not taking: Reported on 01/01/2019 12/24/18   Derek Jack, MD    Family History Family History  Problem Relation Age of Onset  . Cancer Mother   . Cancer Father   . Cancer Brother     Social History Social History   Tobacco Use  . Smoking status: Former Smoker    Packs/day: 1.00    Years: 45.00    Pack years: 45.00    Last attempt to quit: 10/15/2018    Years since quitting: 0.2  . Smokeless tobacco: Never Used  Substance Use Topics  . Alcohol use: Not Currently  . Drug use: Never     Allergies   Demerol [meperidine]; Oxycodone-acetaminophen; and Propoxyphene n-acetaminophen   Review of Systems Review of Systems  Constitutional: Negative for chills and fever.  HENT: Positive for trouble swallowing. Negative for congestion and sore throat.   Eyes: Negative.   Respiratory: Positive for choking. Negative for cough, chest tightness, shortness of breath, wheezing and stridor.   Cardiovascular: Negative for chest pain.  Gastrointestinal: Negative for abdominal pain, nausea and vomiting.  Genitourinary: Negative.   Musculoskeletal: Negative for arthralgias, joint swelling and neck pain.  Skin: Negative.  Negative for rash and wound.  Neurological: Positive for weakness. Negative for dizziness, light-headedness, numbness and headaches.  Psychiatric/Behavioral: Negative.      Physical Exam Updated Vital Signs BP (!) 161/76   Pulse 84   Temp 98.1 F (36.7 C) (Oral)   Resp (!) 22    Ht 5\' 4"  (1.626 m)   Wt 59.9 kg   SpO2 98%   BMI 22.66 kg/m   Physical Exam Vitals signs and nursing note reviewed.  Constitutional:      Appearance: She is well-developed.  HENT:     Head: Normocephalic and atraumatic.     Mouth/Throat:     Pharynx: Oropharynx is clear. No oropharyngeal exudate.  Eyes:     Conjunctiva/sclera: Conjunctivae normal.  Neck:     Musculoskeletal: Normal range of motion and neck supple. No neck rigidity or muscular tenderness.     Comments: Well healed surgical incision from prior thyroid surgery. Cardiovascular:     Rate and Rhythm: Normal rate and regular rhythm.     Heart sounds: Normal heart sounds.  Pulmonary:     Effort: Pulmonary effort is normal.  Breath sounds: Normal breath sounds. No stridor. No wheezing or rhonchi.  Chest:     Chest wall: No tenderness.  Abdominal:     General: Bowel sounds are normal.     Palpations: Abdomen is soft.     Tenderness: There is no abdominal tenderness.  Musculoskeletal: Normal range of motion.  Skin:    General: Skin is warm and dry.  Neurological:     General: No focal deficit present.     Mental Status: She is alert.      ED Treatments / Results  Labs (all labs ordered are listed, but only abnormal results are displayed) Labs Reviewed  CBC WITH DIFFERENTIAL/PLATELET - Abnormal; Notable for the following components:      Result Value   WBC 13.5 (*)    RBC 3.80 (*)    Hemoglobin 10.8 (*)    HCT 34.8 (*)    Platelets 533 (*)    Neutro Abs 8.0 (*)    Monocytes Absolute 1.1 (*)    Eosinophils Absolute 0.6 (*)    All other components within normal limits  COMPREHENSIVE METABOLIC PANEL - Abnormal; Notable for the following components:   Glucose, Bld 119 (*)    AST 11 (*)    All other components within normal limits  TROPONIN I    EKG EKG Interpretation  Date/Time:  Sunday January 02 2019 00:25:26 EDT Ventricular Rate:  81 PR Interval:    QRS Duration: 80 QT Interval:  374 QTC  Calculation: 435 R Axis:   62 Text Interpretation:  Sinus rhythm Probable anteroseptal infarct, old No significant change was found Confirmed by Ezequiel Essex [phone]) on 01/02/2019 12:28:00 AM   Radiology Results for orders placed or performed during the hospital encounter of 01/01/19  CBC with Differential/Platelet  Result Value Ref Range   WBC 13.5 (H) 4.0 - 10.5 K/uL   RBC 3.80 (L) 3.87 - 5.11 MIL/uL   Hemoglobin 10.8 (L) 12.0 - 15.0 g/dL   HCT 34.8 (L) 36.0 - 46.0 %   MCV 91.6 80.0 - 100.0 fL   MCH 28.4 26.0 - 34.0 pg   MCHC 31.0 30.0 - 36.0 g/dL   RDW 13.4 11.5 - 15.5 %   Platelets 533 (H) 150 - 400 K/uL   nRBC 0.0 0.0 - 0.2 %   Neutrophils Relative % 60 %   Neutro Abs 8.0 (H) 1.7 - 7.7 K/uL   Lymphocytes Relative 28 %   Lymphs Abs 3.8 0.7 - 4.0 K/uL   Monocytes Relative 8 %   Monocytes Absolute 1.1 (H) 0.1 - 1.0 K/uL   Eosinophils Relative 4 %   Eosinophils Absolute 0.6 (H) 0.0 - 0.5 K/uL   Basophils Relative 0 %   Basophils Absolute 0.0 0.0 - 0.1 K/uL   Immature Granulocytes 0 %   Abs Immature Granulocytes 0.05 0.00 - 0.07 K/uL  Comprehensive metabolic panel  Result Value Ref Range   Sodium 137 135 - 145 mmol/L   Potassium 5.0 3.5 - 5.1 mmol/L   Chloride 98 98 - 111 mmol/L   CO2 26 22 - 32 mmol/L   Glucose, Bld 119 (H) 70 - 99 mg/dL   BUN 12 8 - 23 mg/dL   Creatinine, Ser 0.87 0.44 - 1.00 mg/dL   Calcium 9.5 8.9 - 10.3 mg/dL   Total Protein 7.8 6.5 - 8.1 g/dL   Albumin 3.9 3.5 - 5.0 g/dL   AST 11 (L) 15 - 41 U/L   ALT 13 0 - 44 U/L  Alkaline Phosphatase 95 38 - 126 U/L   Total Bilirubin 0.3 0.3 - 1.2 mg/dL   GFR calc non Af Amer >60 >60 mL/min   GFR calc Af Amer >60 >60 mL/min   Anion gap 13 5 - 15  Troponin I - ONCE - STAT  Result Value Ref Range   Troponin I <0.03 <0.03 ng/mL   Dg Neck Soft Tissue  Result Date: 01/01/2019 CLINICAL DATA:  63 year old female with difficulty by drinking and eating for the past 2 weeks. No associated nausea, vomiting or  diarrhea. EXAM: NECK SOFT TISSUES - 1+ VIEW COMPARISON:  No priors. FINDINGS: There is no evidence of retropharyngeal soft tissue swelling or epiglottic enlargement. The cervical airway is unremarkable and no radio-opaque foreign body identified. Multiple surgical clips are noted from prior thyroidectomy. IMPRESSION: Negative. Electronically Signed   By: Vinnie Langton M.D.   On: 01/01/2019 23:34    Procedures Procedures (including critical care time)  Medications Ordered in ED Medications  sodium chloride 0.9 % bolus 1,000 mL (1,000 mLs Intravenous New Bag/Given 01/02/19 0024)  alum & mag hydroxide-simeth (MAALOX/MYLANTA) 200-200-20 MG/5ML suspension 30 mL (30 mLs Oral Given 01/02/19 0101)    And  lidocaine (XYLOCAINE) 2 % viscous mouth solution 15 mL (15 mLs Oral Given 01/02/19 0102)     Initial Impression / Assessment and Plan / ED Course  I have reviewed the triage vital signs and the nursing notes.  Pertinent labs & imaging results that were available during my care of the patient were reviewed by me and considered in my medical decision making (see chart for details).        Dysphagia/globus sensation of unclear etiology, soft tissue neck negative.  Pt has no stridor or wheezing, no respiratory distress.  She is swallowing her saliva, doubt food bolus impaction.  Discussed with Dr Wyvonnia Dusky who assumes care of patient.  Final Clinical Impressions(s) / ED Diagnoses   Final diagnoses:  None    ED Discharge Orders    None       Landis Martins 01/02/19 0107    Ezequiel Essex, MD 01/02/19 [phone]

## 2019-01-02 NOTE — ED Provider Notes (Signed)
Patient with metastatic lung cancer and renal infarcts on eliquis presenting with 2-week history of difficulty drinking and swallowing and dysphasia.  States she has feeling like something is getting caught in her throat when she tries to swallow.  She has had several episodes of spitting up.  States this started after her bronchoscopy on May 26.  She has known metastatic lung cancer and just had a biopsy of a pelvic mass yesterday.  She came in tonight because she is having generalized weakness associate with her difficulty swallowing not wanting to eat or drink.  She is apparently going to have a swallow study next week.  She denies any fevers.  She denies difficulty swallowing her secretions.  Patient is in no distress.  She has a soft voice but no stridor.  Her oropharynx is clear.  Anterior neck scar from previous thyroidectomy.  No palpable masses. Lungs are clear. There does not appear to be any evidence of acute esophageal obstruction  Soft tissue neck x-ray is negative.  Patient given IV fluids.  Her labs are reassuring with normal electrolytes and creatinine.  She is able to tolerate fluids in the ED.  Plan to obtain CT scan of her neck to further evaluate her dysphasia.  Patient declines this and states she just wants to follow with her oncologist as she scheduled this week.  She appears to have capacity to make medical decisions and understands that something could be missed without doing imaging tonight.  She will be leaving AGAINST MEDICAL ADVICE.  She was able to tolerate some liquids in the ED and ambulate.  She declines any further testing tonight and states she will follow-up with her specialist as scheduled for her swallow study. She understands she is free to return at any time.    Ezequiel Essex, MD 01/02/19 [phone]

## 2019-01-02 NOTE — Discharge Instructions (Addendum)
Your blood work looks good.  You declined CT scan of your neck today to further evaluate your swallowing problems.  You should follow-up with your doctors for your swallowing tests as scheduled.  Return to the ED if you wish to be reevaluated or have any new symptoms.

## 2019-01-03 ENCOUNTER — Other Ambulatory Visit: Payer: Self-pay

## 2019-01-03 ENCOUNTER — Other Ambulatory Visit (HOSPITAL_COMMUNITY): Payer: Self-pay | Admitting: Specialist

## 2019-01-03 DIAGNOSIS — R1319 Other dysphagia: Secondary | ICD-10-CM

## 2019-01-04 ENCOUNTER — Other Ambulatory Visit (HOSPITAL_COMMUNITY): Payer: BC Managed Care – PPO

## 2019-01-04 ENCOUNTER — Ambulatory Visit (HOSPITAL_COMMUNITY): Payer: BC Managed Care – PPO | Admitting: Speech Pathology

## 2019-01-04 ENCOUNTER — Encounter (HOSPITAL_COMMUNITY): Payer: Self-pay | Admitting: Dietician

## 2019-01-04 ENCOUNTER — Encounter (HOSPITAL_COMMUNITY): Payer: BC Managed Care – PPO | Admitting: Dietician

## 2019-01-04 ENCOUNTER — Ambulatory Visit (HOSPITAL_COMMUNITY): Payer: BC Managed Care – PPO | Admitting: Hematology

## 2019-01-04 MED ORDER — CALCIUM-PHOSPHORUS PO
ORAL | Status: DC
Start: ? — End: 2019-01-04

## 2019-01-04 MED ORDER — VICON FORTE PO CAPS
17.00 | ORAL_CAPSULE | ORAL | Status: DC
Start: 2019-01-05 — End: 2019-01-04

## 2019-01-04 MED ORDER — SELECT BRAND INSULIN SYRINGE 29G X 1/2" 1 ML MISC
2.00 | Status: DC
Start: 2019-01-21 — End: 2019-01-04

## 2019-01-04 MED ORDER — LOVENOX 150 MG/ML ~~LOC~~ SOLN
0.50 | SUBCUTANEOUS | Status: DC
Start: ? — End: 2019-01-04

## 2019-01-04 MED ORDER — IRBESARTAN
0.50 | Status: DC
Start: ? — End: 2019-01-04

## 2019-01-04 MED ORDER — MORPHINE SULFATE ER 15 MG PO TBCR
1000.00 | EXTENDED_RELEASE_TABLET | ORAL | Status: DC
Start: ? — End: 2019-01-04

## 2019-01-04 MED ORDER — LEVOTHYROXINE SODIUM 100 MCG PO TABS
100.00 | ORAL_TABLET | ORAL | Status: DC
Start: 2019-01-05 — End: 2019-01-04

## 2019-01-04 NOTE — Progress Notes (Signed)
RD had been scheduled for telephone consultation today with pt. Apparently pt was in ED yesterday. She was discharged home and went directly to [facility] ED where she is currently admitted. Review of Care Everywhere shows that the patient has elected to transfer her oncology care to [facility] d/t being unhappy with care at [facility]. Given her transfer of care, will stop following patient at this time.    Burtis Junes RD, LDN, CNSC Clinical Nutrition Available Tues-Sat via Pager: [phone] 01/04/2019 9:36 AM

## 2019-01-05 ENCOUNTER — Encounter (HOSPITAL_COMMUNITY): Payer: Self-pay

## 2019-01-10 ENCOUNTER — Encounter (HOSPITAL_COMMUNITY): Payer: Self-pay | Admitting: *Deleted

## 2019-01-10 NOTE — Progress Notes (Signed)
I received a voicemail from Alonna Minium regarding paperwork that we faxed on Yolanda Miller.  She was very urgent in needing our return call.  I have attempted three times this morning to return her call and I do not get a voicemail or way to enter the extension she provided.

## 2019-01-21 MED ORDER — BUPIVILOG IJ
0.10 | INTRAMUSCULAR | Status: DC
Start: ? — End: 2019-01-21

## 2019-01-21 MED ORDER — MYLANTA ULTRA 700-300 MG PO CHEW
3.00 | CHEWABLE_TABLET | ORAL | Status: DC
Start: ? — End: 2019-01-21

## 2019-01-21 MED ORDER — GUAIFENESIN 100 MG/5ML PO SYRP
400.00 | ORAL_SOLUTION | ORAL | Status: DC
Start: 2019-01-21 — End: 2019-01-21

## 2019-01-21 MED ORDER — MELATONIN 3 MG PO TABS
3.00 | ORAL_TABLET | ORAL | Status: DC
Start: 2019-01-20 — End: 2019-01-21

## 2019-01-21 MED ORDER — Medication
10.00 | Status: DC
Start: 2019-01-20 — End: 2019-01-21

## 2019-01-21 MED ORDER — ADRENAL 80 MG OR TABS
2.00 | ORAL_TABLET | ORAL | Status: DC
Start: ? — End: 2019-01-21

## 2019-01-21 MED ORDER — Medication
100.00 | Status: DC
Start: 2019-01-21 — End: 2019-01-21

## 2019-01-21 MED ORDER — Medication
Status: DC
Start: ? — End: 2019-01-21

## 2019-01-21 MED ORDER — STRI-DEX MAXIMUM STRENGTH 2 % EX PADS
50.00 | MEDICATED_PAD | CUTANEOUS | Status: DC
Start: ? — End: 2019-01-21

## 2019-01-21 MED ORDER — Medication
25.00 | Status: DC
Start: ? — End: 2019-01-21

## 2019-01-21 MED ORDER — PRENATAL CA-B6-B12-FA-GINGER
10.00 | Status: DC
Start: 2019-01-21 — End: 2019-01-21

## 2019-01-21 MED ORDER — METHEN-BELLA-METH BL-PHEN SAL PO TABS
100.00 | ORAL_TABLET | ORAL | Status: DC
Start: 2019-01-21 — End: 2019-01-21

## 2019-01-21 MED ORDER — Medication
80.00 | Status: DC
Start: ? — End: 2019-01-21

## 2019-01-21 MED ORDER — DAYHIST-1 PO
15.00 | ORAL | Status: DC
Start: 2019-01-20 — End: 2019-01-21

## 2019-01-21 MED ORDER — PYRILAMINE TAN-PHENYLEPH TAN 30-5 MG/5ML PO SUSP
25.00 | ORAL | Status: DC
Start: ? — End: 2019-01-21

## 2019-01-21 MED ORDER — DICLOXACILLIN SODIUM 62.5 MG/5ML PO SUSR
10.00 | ORAL | Status: DC
Start: ? — End: 2019-01-21

## 2019-01-21 MED ORDER — EQUATE NICOTINE 4 MG MT GUM
4.00 | CHEWING_GUM | OROMUCOSAL | Status: DC
Start: ? — End: 2019-01-21

## 2019-01-21 MED ORDER — AK-NACL 5 % OP OINT
10.00 | TOPICAL_OINTMENT | OPHTHALMIC | Status: DC
Start: ? — End: 2019-01-21

## 2019-01-21 MED ORDER — MAGNESIUM CARBONATE POWD
1.00 | Status: DC
Start: ? — End: 2019-01-21

## 2019-01-21 MED ORDER — SODIUM CHLORIDE 0.9 % IV SOLN
INTRAVENOUS | Status: DC
Start: ? — End: 2019-01-21

## 2019-01-21 MED ORDER — LOVENOX 150 MG/ML ~~LOC~~ SOLN
0.25 | SUBCUTANEOUS | Status: DC
Start: ? — End: 2019-01-21

## 2019-01-21 MED ORDER — HALENOL 160 MG/5ML OR LIQD
0.20 | ORAL | Status: DC
Start: ? — End: 2019-01-21

## 2019-01-21 MED ORDER — ENTAB-DM 60-1200 MG PO TB12
1.00 | ORAL_TABLET | ORAL | Status: DC
Start: 2019-01-21 — End: 2019-01-21

## 2019-01-26 DEATH — deceased

## 2019-05-18 NOTE — Progress Notes (Signed)
This encounter was created in error - please disregard.

## 2020-12-25 IMAGING — MR MRI HEAD WITHOUT AND WITH CONTRAST
7 of 13 series · 26 of 48 positions shown · IV contrast (6ml Gadavist)
Comparison: None.

CLINICAL DATA: Weakness, difficulty walking, low back pain. New
diagnosis of lung cancer.

EXAM:
MRI HEAD WITHOUT AND WITH CONTRAST
TECHNIQUE: Multiplanar, multiecho pulse sequences of the brain and surrounding
structures were obtained without and with intravenous contrast.
CONTRAST:  Gadavist 6 mL.

[Series 3: DWI · axial · 3.0mm · 0.75mm/px · z∈[-81,+78]mm · 5 of 55 slices shown (1 of 2)]
[im 1/55]
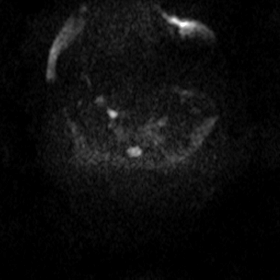
[im 14/55]
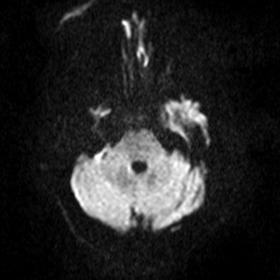
[im 28/55]
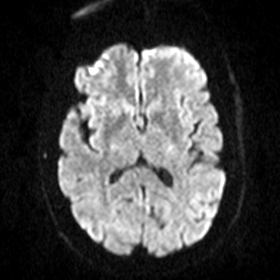
[im 41/55]
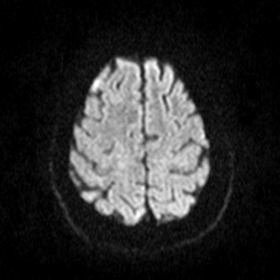
[im 55/55]
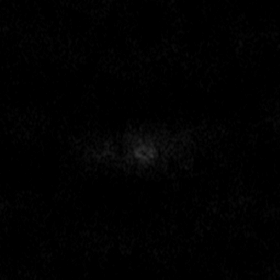

[Series 5: DWI · coronal · 5.0mm · 0.49mm/px · 3 of 34 slices shown (2 of 2)]
[im 1/34]
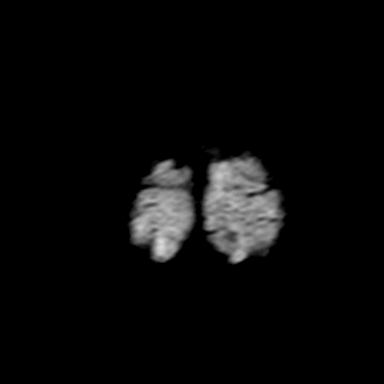
[im 17/34]
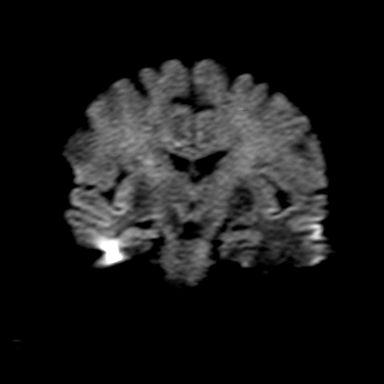
[im 34/34]
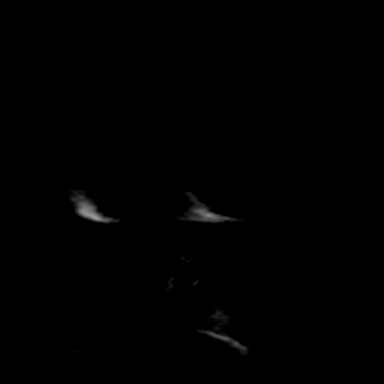

[Series 8: T2 · axial · 5.0mm · 0.45mm/px · z∈[-69,+70]mm · 2 of 23 slices shown]
[im 1/23]
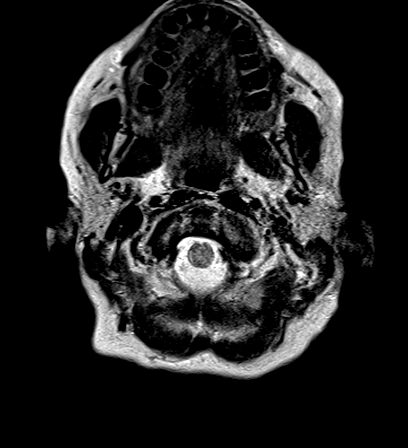
[im 23/23]
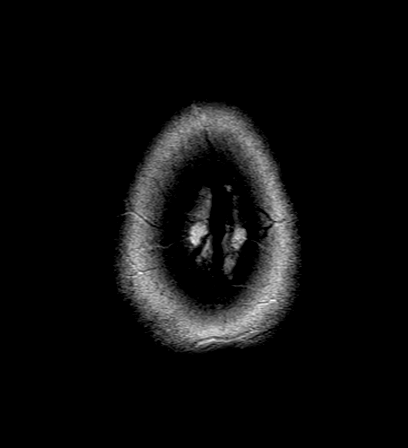

[Series 10: FLAIR · axial · 3.0mm · 0.41mm/px · z∈[-71,+64]mm · 4 of 47 slices shown]
[im 1/47]
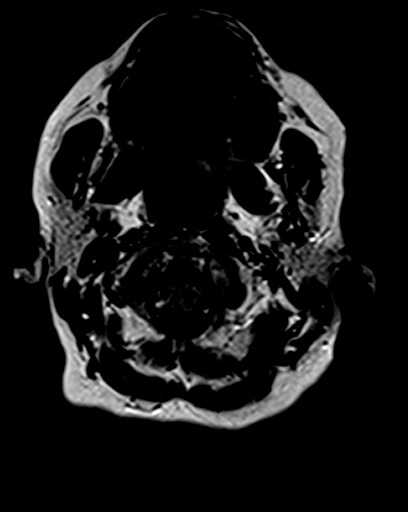
[im 16/47]
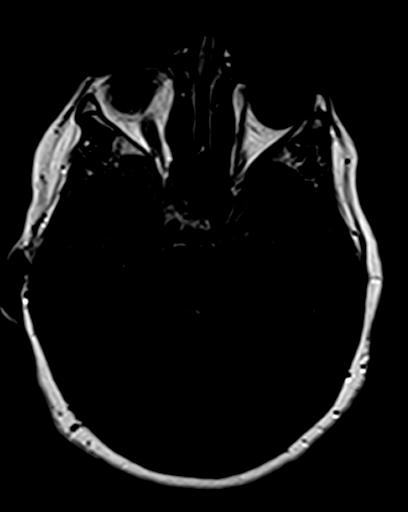
[im 31/47]
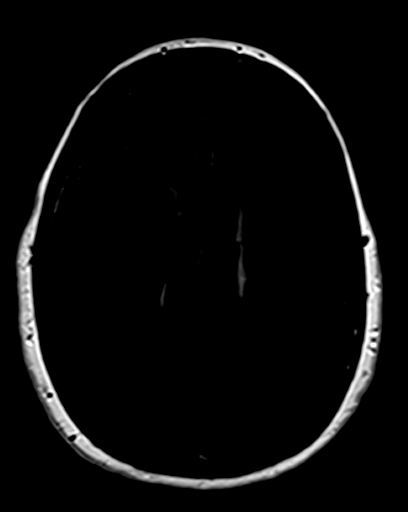
[im 47/47]
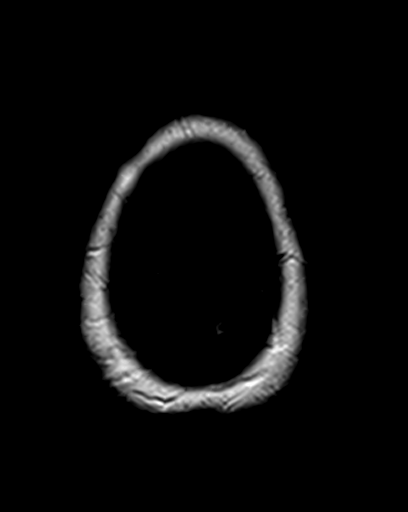

[Series 14: T1 post-contrast · axial · 2.0mm · 0.41mm/px · z∈[-56,+103]mm · 7 of 81 slices shown (1 of 3)]
[im 1/81]
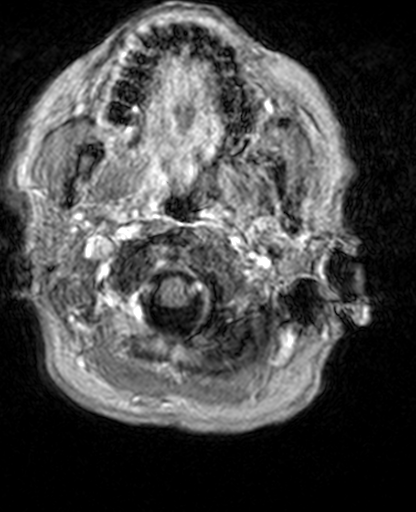
[im 14/81]
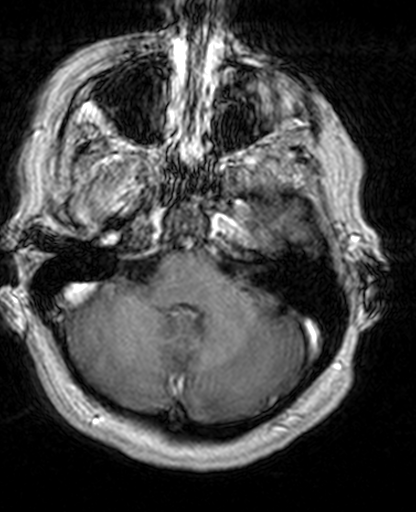
[im 27/81]
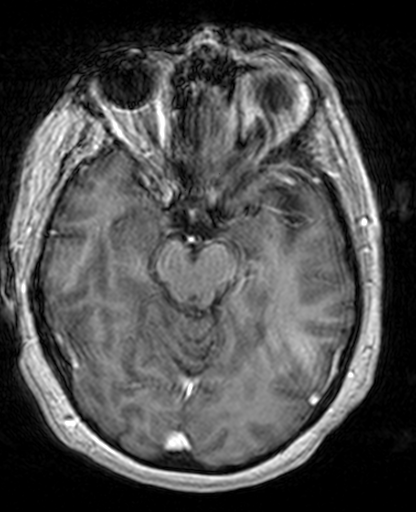
[im 41/81]
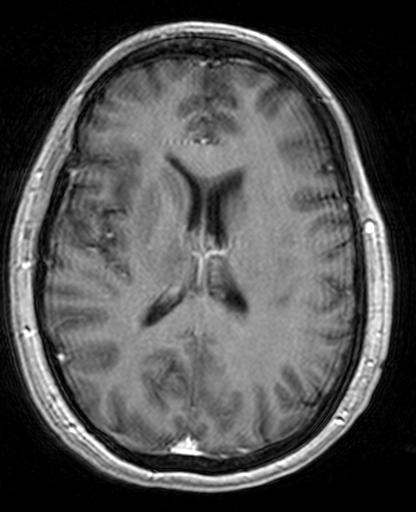
[im 54/81]
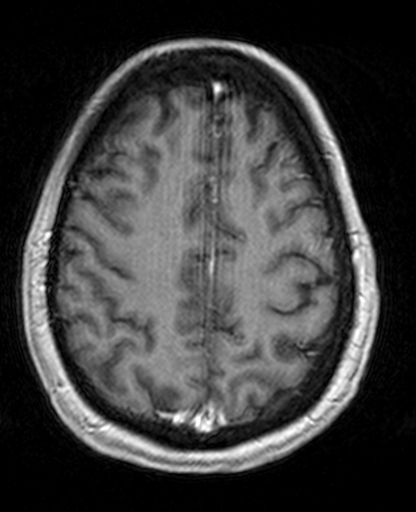
[im 67/81]
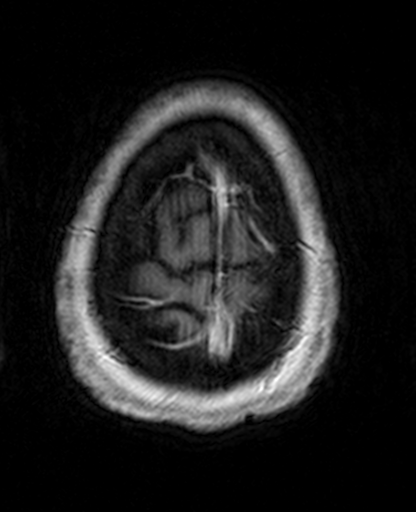
[im 81/81]
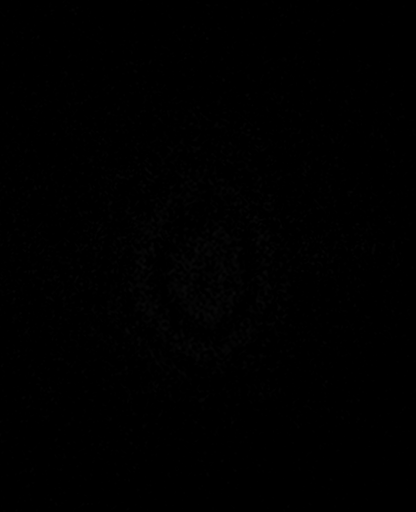

[Series 15: T1 post-contrast · coronal · 5.0mm · 0.37mm/px · 3 of 28 slices shown (2 of 3)]
[im 1/28]
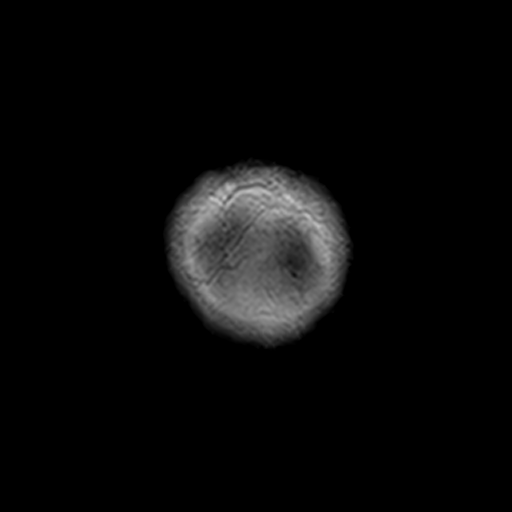
[im 14/28]
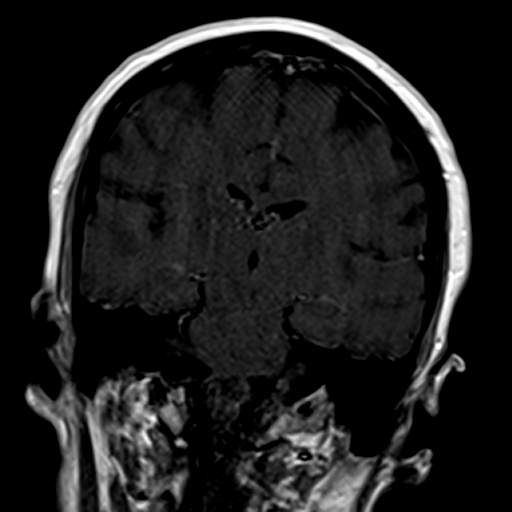
[im 28/28]
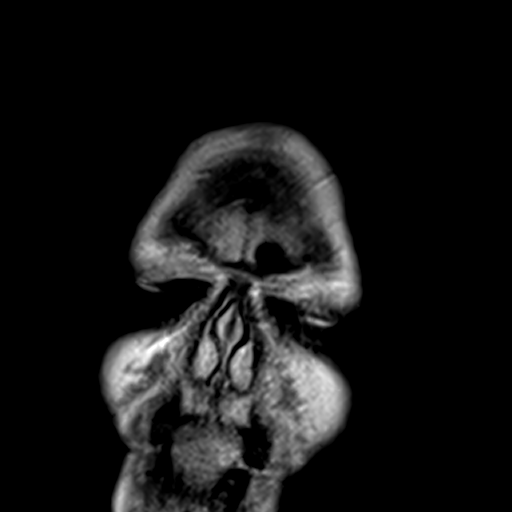

[Series 16: T1 post-contrast · sagittal · 5.0mm · 0.40mm/px · 2 of 20 slices shown (3 of 3)]
[im 1/20]
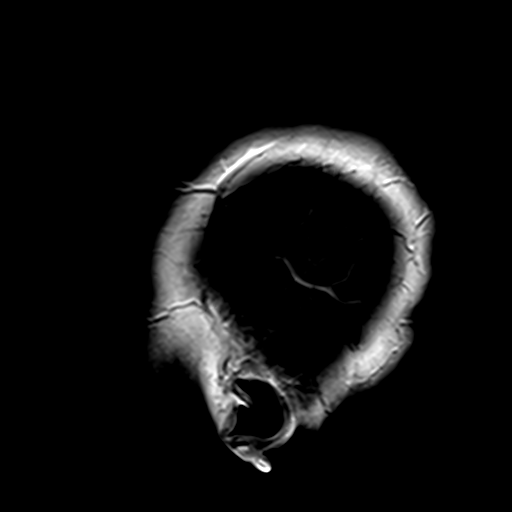
[im 20/20]
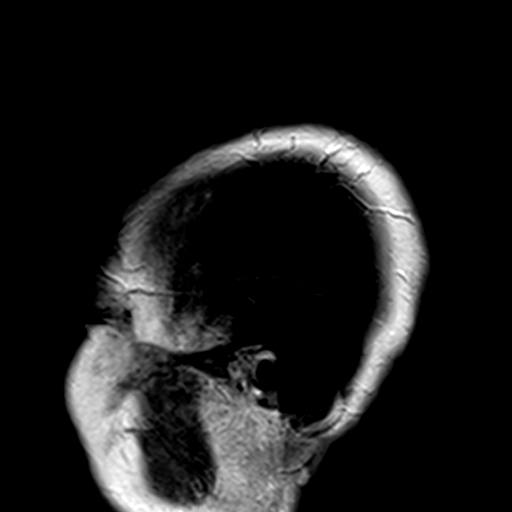

[26 of 48 positions shown; findings below may reference images not displayed]

FINDINGS: The patient was unable to remain motionless for the exam. Small or
subtle lesions could be overlooked.

Brain: No evidence for acute infarction, hemorrhage, mass lesion,
hydrocephalus, or extra-axial fluid. Normal for age cerebral volume.
Minor T2 and FLAIR hyperintensities in the periventricular and
subcortical white matter likely early small vessel disease.

Post infusion, no abnormal enhancement of the brain or meninges.

Vascular: Flow voids are maintained throughout the carotid, basilar,
and vertebral arteries. There are no areas of chronic hemorrhage.

Skull and upper cervical spine: Unremarkable visualized calvarium,
skullbase, and cervical vertebrae. Pituitary, pineal, cerebellar
tonsils unremarkable. No upper cervical cord lesions.

Sinuses/Orbits: No orbital masses or proptosis. Globes appear
symmetric. Sinuses appear well aerated, without evidence for
air-fluid level.

Other: Unremarkable visualized calvarium, skullbase, and cervical
vertebrae. Pituitary, pineal, cerebellar tonsils unremarkable. No
upper cervical cord lesions.
IMPRESSION: Within limits for detection due to patient motion, no acute
intracranial findings.

No visible intracranial metastatic disease is observed. No visible
osseous disease.
